# Patient Record
Sex: Female | Born: 1960 | Race: White | Hispanic: No | Marital: Married | Smoking: Never smoker
Health system: Southern US, Community
[De-identification: ages and names within clinical notes are randomized; demographics above are authoritative.]

## PROBLEM LIST (undated history)

## (undated) DIAGNOSIS — J310 Chronic rhinitis: Secondary | ICD-10-CM

## (undated) DIAGNOSIS — F419 Anxiety disorder, unspecified: Secondary | ICD-10-CM

## (undated) DIAGNOSIS — E7800 Pure hypercholesterolemia, unspecified: Secondary | ICD-10-CM

## (undated) DIAGNOSIS — D649 Anemia, unspecified: Secondary | ICD-10-CM

## (undated) DIAGNOSIS — F32A Depression, unspecified: Secondary | ICD-10-CM

## (undated) DIAGNOSIS — F329 Major depressive disorder, single episode, unspecified: Secondary | ICD-10-CM

## (undated) DIAGNOSIS — J329 Chronic sinusitis, unspecified: Secondary | ICD-10-CM

## (undated) DIAGNOSIS — J309 Allergic rhinitis, unspecified: Secondary | ICD-10-CM

## (undated) HISTORY — DX: Allergic rhinitis, unspecified: J30.9

## (undated) HISTORY — DX: Major depressive disorder, single episode, unspecified: F32.9

## (undated) HISTORY — PX: TUBAL LIGATION: SHX77

## (undated) HISTORY — PX: BUNIONECTOMY: SHX129

## (undated) HISTORY — DX: Anxiety disorder, unspecified: F41.9

## (undated) HISTORY — PX: TONSILLECTOMY: SUR1361

## (undated) HISTORY — DX: Chronic rhinitis: J31.0

## (undated) HISTORY — DX: Depression, unspecified: F32.A

## (undated) HISTORY — PX: OTHER SURGICAL HISTORY: SHX169

## (undated) HISTORY — PX: CHOLECYSTECTOMY: SHX55

## (undated) HISTORY — DX: Pure hypercholesterolemia, unspecified: E78.00

## (undated) HISTORY — PX: HEMORRHOID BANDING: SHX5850

## (undated) HISTORY — DX: Anemia, unspecified: D64.9

## (undated) HISTORY — DX: Chronic sinusitis, unspecified: J32.9

---

## 1998-09-11 ENCOUNTER — Inpatient Hospital Stay (HOSPITAL_COMMUNITY): Admission: AD | Admit: 1998-09-11 | Discharge: 1998-09-11 | Payer: Self-pay | Admitting: Obstetrics and Gynecology

## 1999-01-08 ENCOUNTER — Inpatient Hospital Stay (HOSPITAL_COMMUNITY): Admission: AD | Admit: 1999-01-08 | Discharge: 1999-01-11 | Payer: Self-pay | Admitting: Obstetrics and Gynecology

## 1999-01-09 ENCOUNTER — Encounter: Payer: Self-pay | Admitting: Obstetrics and Gynecology

## 1999-03-30 ENCOUNTER — Inpatient Hospital Stay (HOSPITAL_COMMUNITY): Admission: AD | Admit: 1999-03-30 | Discharge: 1999-03-30 | Payer: Self-pay | Admitting: Obstetrics and Gynecology

## 1999-03-31 ENCOUNTER — Inpatient Hospital Stay (HOSPITAL_COMMUNITY): Admission: AD | Admit: 1999-03-31 | Discharge: 1999-03-31 | Payer: Self-pay | Admitting: Obstetrics and Gynecology

## 1999-04-02 ENCOUNTER — Inpatient Hospital Stay (HOSPITAL_COMMUNITY): Admission: AD | Admit: 1999-04-02 | Discharge: 1999-04-02 | Payer: Self-pay | Admitting: Obstetrics and Gynecology

## 1999-04-07 ENCOUNTER — Inpatient Hospital Stay (HOSPITAL_COMMUNITY): Admission: AD | Admit: 1999-04-07 | Discharge: 1999-04-07 | Payer: Self-pay | Admitting: Obstetrics and Gynecology

## 1999-04-07 ENCOUNTER — Observation Stay (HOSPITAL_COMMUNITY): Admission: AD | Admit: 1999-04-07 | Discharge: 1999-04-07 | Payer: Self-pay | Admitting: Obstetrics & Gynecology

## 1999-04-09 ENCOUNTER — Inpatient Hospital Stay (HOSPITAL_COMMUNITY): Admission: AD | Admit: 1999-04-09 | Discharge: 1999-04-12 | Payer: Self-pay | Admitting: Obstetrics and Gynecology

## 1999-04-09 ENCOUNTER — Encounter (INDEPENDENT_AMBULATORY_CARE_PROVIDER_SITE_OTHER): Payer: Self-pay

## 1999-07-15 ENCOUNTER — Encounter: Admission: RE | Admit: 1999-07-15 | Discharge: 1999-08-17 | Payer: Self-pay | Admitting: Obstetrics and Gynecology

## 1999-07-16 ENCOUNTER — Encounter (INDEPENDENT_AMBULATORY_CARE_PROVIDER_SITE_OTHER): Payer: Self-pay

## 1999-07-16 ENCOUNTER — Ambulatory Visit (HOSPITAL_COMMUNITY): Admission: RE | Admit: 1999-07-16 | Discharge: 1999-07-17 | Payer: Self-pay | Admitting: Surgery

## 2000-05-12 ENCOUNTER — Other Ambulatory Visit: Admission: RE | Admit: 2000-05-12 | Discharge: 2000-05-12 | Payer: Self-pay | Admitting: Obstetrics and Gynecology

## 2000-05-29 ENCOUNTER — Encounter: Payer: Self-pay | Admitting: Obstetrics and Gynecology

## 2000-05-29 ENCOUNTER — Ambulatory Visit (HOSPITAL_COMMUNITY): Admission: RE | Admit: 2000-05-29 | Discharge: 2000-05-29 | Payer: Self-pay | Admitting: Obstetrics and Gynecology

## 2000-06-01 ENCOUNTER — Inpatient Hospital Stay (HOSPITAL_COMMUNITY): Admission: AD | Admit: 2000-06-01 | Discharge: 2000-06-01 | Payer: Self-pay | Admitting: Obstetrics and Gynecology

## 2000-10-24 ENCOUNTER — Inpatient Hospital Stay (HOSPITAL_COMMUNITY): Admission: AD | Admit: 2000-10-24 | Discharge: 2000-10-27 | Payer: Self-pay | Admitting: Obstetrics and Gynecology

## 2000-10-24 ENCOUNTER — Encounter (INDEPENDENT_AMBULATORY_CARE_PROVIDER_SITE_OTHER): Payer: Self-pay | Admitting: *Deleted

## 2000-10-28 ENCOUNTER — Encounter: Admission: RE | Admit: 2000-10-28 | Discharge: 2000-11-27 | Payer: Self-pay | Admitting: Obstetrics and Gynecology

## 2003-03-27 ENCOUNTER — Encounter: Admission: RE | Admit: 2003-03-27 | Discharge: 2003-03-27 | Payer: Self-pay | Admitting: Internal Medicine

## 2003-08-01 ENCOUNTER — Encounter: Admission: RE | Admit: 2003-08-01 | Discharge: 2003-08-01 | Payer: Self-pay | Admitting: Obstetrics and Gynecology

## 2004-05-19 ENCOUNTER — Ambulatory Visit (HOSPITAL_COMMUNITY): Admission: RE | Admit: 2004-05-19 | Discharge: 2004-05-19 | Payer: Self-pay | Admitting: Obstetrics and Gynecology

## 2005-07-27 ENCOUNTER — Ambulatory Visit (HOSPITAL_COMMUNITY): Admission: RE | Admit: 2005-07-27 | Discharge: 2005-07-27 | Payer: Self-pay | Admitting: Obstetrics and Gynecology

## 2007-03-08 ENCOUNTER — Ambulatory Visit (HOSPITAL_COMMUNITY): Admission: RE | Admit: 2007-03-08 | Discharge: 2007-03-08 | Payer: Self-pay | Admitting: Obstetrics and Gynecology

## 2007-05-22 ENCOUNTER — Emergency Department (HOSPITAL_COMMUNITY): Admission: EM | Admit: 2007-05-22 | Discharge: 2007-05-22 | Payer: Self-pay | Admitting: Emergency Medicine

## 2008-03-11 ENCOUNTER — Ambulatory Visit (HOSPITAL_COMMUNITY): Admission: RE | Admit: 2008-03-11 | Discharge: 2008-03-11 | Payer: Self-pay | Admitting: Obstetrics and Gynecology

## 2009-03-16 ENCOUNTER — Ambulatory Visit (HOSPITAL_COMMUNITY): Admission: RE | Admit: 2009-03-16 | Discharge: 2009-03-16 | Payer: Self-pay | Admitting: Obstetrics and Gynecology

## 2009-12-22 ENCOUNTER — Ambulatory Visit: Payer: Self-pay | Admitting: Internal Medicine

## 2009-12-22 DIAGNOSIS — J4540 Moderate persistent asthma, uncomplicated: Secondary | ICD-10-CM | POA: Insufficient documentation

## 2009-12-22 DIAGNOSIS — J45909 Unspecified asthma, uncomplicated: Secondary | ICD-10-CM | POA: Insufficient documentation

## 2009-12-22 DIAGNOSIS — J3089 Other allergic rhinitis: Secondary | ICD-10-CM

## 2009-12-22 DIAGNOSIS — J302 Other seasonal allergic rhinitis: Secondary | ICD-10-CM | POA: Insufficient documentation

## 2009-12-22 LAB — CONVERTED CEMR LAB: IgE (Immunoglobulin E), Serum: 44.4 intl units/mL (ref 0.0–180.0)

## 2010-01-28 ENCOUNTER — Ambulatory Visit: Payer: Self-pay | Admitting: Internal Medicine

## 2010-01-28 LAB — PULMONARY FUNCTION TEST

## 2010-03-17 ENCOUNTER — Ambulatory Visit (HOSPITAL_COMMUNITY): Admission: RE | Admit: 2010-03-17 | Discharge: 2010-03-17 | Payer: Self-pay | Admitting: Obstetrics & Gynecology

## 2010-05-28 ENCOUNTER — Ambulatory Visit
Admission: RE | Admit: 2010-05-28 | Discharge: 2010-05-28 | Payer: Self-pay | Source: Home / Self Care | Attending: Internal Medicine | Admitting: Internal Medicine

## 2010-06-15 NOTE — Assessment & Plan Note (Signed)
Summary: rov after pft ///kp   Primary Anija Brickner/Referring Nanette Wirsing:  Synetta Fail, MD  CC:  Follow up visit-review PFT results..  History of Present Illness: History of Present Illness: December 22, 2009- 48 yoF referred courtesy of Dr Leda Quail for pulmonary allergy evaluation, concerned about asthma. Never smoked. No childhood respiratory problems or asthma. Has had variable itching eyes, watery nose, nasal congestion perennially for an uncertain long time.  Wheeze began intermittently 8 years ago, around the time she moved into current home. This was built in 1973. Renovated with all hardwood/ tile, little carpet; basement; no mold; gas heat; no smokers; no indoor pets. No effect when she is away on vacation. In last 4 years has been going to her primary office in [place], Rx'd Advair, prednisone, rescue inhaler. Now on Advair 250/50 which makes her hoarse. Nasonex caused occasional epistaxis, Omnaris keeps her awake. Originally copugh/ wheeze intermittently at night but gradually now daily wheeze. Wheeze worse if nose congested. lat prednisone months ago- dislikes the way it makes her feel. Dyspnea limits her walking or tennis for exercise. Prior allergy skin tested twice, including at [facility]: common inhalants, but never tried allergy vaccine.  Rhinosinusitis eval by ENT w/ rhinoscopy, dx'd staph, Rx'd Bactrim. Got better after that with less nasal congestion and chest also improved then.  January 28, 2010- Asthma....................Marland Kitchenhusband here Regular Singulair has been helping a lot. Recently feeling tight in throat without much itching. Nose clear, but trying regular use of nasal steroid associated with nose bleeds again. Does use the Symbicort twice daily. She will use up left over Advair. She forgot but plans to bring [place] old allergy test results.  PFT- WNL except mild reduction of DLCO 75%. Allergy profile- Total IgE 44.4, neg for specific elevations. CXR- low lung volumes  with basilar scarring   Asthma History    Asthma Control Assessment:    Age range: 12+ years    Symptoms: 0-2 days/week    Nighttime Awakenings: 0-2/month    Interferes w/ normal activity: no limitations    SABA use (not for EIB): 0-2 days/week    FEV1: 2.29 liters (today)    Asthma Control Assessment: Well Controlled   Preventive Screening-Counseling & Management  Alcohol-Tobacco     Smoking Status: never  Current Medications (verified): 1)  Symbicort 160-4.5 Mcg/act Aero (Budesonide-Formoterol Fumarate) .... 2 Puffs and Rinse, Twice Daily 2)  Nasonex 50 Mcg/act Susp (Mometasone Furoate) .Marland Kitchen.. 1-2 Sprays in Each Nostril Once Daily 3)  Omnaris 50 Mcg/act Susp (Ciclesonide) .Marland Kitchen.. 1 Spray in Each Nostril Once Daily 4)  Veramyst 27.5 Mcg/spray Susp (Fluticasone Furoate) .Marland Kitchen.. 1-2 Puffs Each Nostril Once Daily 5)  Singulair 10 Mg Tabs (Montelukast Sodium) .Marland Kitchen.. 1 Daily  Allergies (verified): 1)  ! Levaquin  Past History:  Past Surgical History: Last updated: 12/22/2009 C-section 6045,40981 tonsils  Family History: Last updated: 12/22/2009 Emphysema: mother Allergies:mother/brother Asthma: mother/brother Cancer: mother  Social History: Last updated: 12/22/2009 Married with children Non Smoker ETOH-1-2 weekly  Risk Factors: Smoking Status: never (01/28/2010)  Past Medical History: Allergic Rhinitis Rhinosinusitis Asthma          PFT 01/28/10- FEV1 2.22/ 87% R 0.82, No resp to dilator, DLCO 75%  Review of Systems      See HPI  The patient denies anorexia, fever, weight loss, weight gain, vision loss, decreased hearing, hoarseness, chest pain, syncope, dyspnea on exertion, peripheral edema, prolonged cough, headaches, hemoptysis, abdominal pain, and severe indigestion/heartburn.    Vital Signs:  Patient profile:  50 year old female Height:      64 inches Weight:      209 pounds BMI:     36.00 O2 Sat:      95 % on Room air Pulse rate:   73 / minute BP  sitting:   126 / 84  (right arm) Cuff size:   large  Vitals Entered By: Reynaldo Minium CMA (January 28, 2010 2:59 PM)  O2 Flow:  Room air CC: Follow up visit-review PFT results.   Physical Exam  Additional Exam:  General: A/Ox3; pleasant and cooperative, NAD, SKIN: no rash, lesions NODES: no lymphadenopathy HEENT: [place]/AT, EOM- WNL, Conjuctivae- clear, PERRLA, TM-WNL, Nose- clear, Throat- clear and wnl, Mallampati  II NECK: Supple w/ fair ROM, JVD- none, normal carotid impulses w/o bruits Thyroid- normal to palpation CHEST: clear to P&A HEART: RRR, no m/g/r heard ABDOMEN: Soft and nl;nml bowel sounds; no organomegaly or masses noted. Overweight ZOX:WRUE, nl pulses, no edema  NEURO: Grossly intact to observation      CXR  Procedure date:  12/22/2009  Findings:      CHEST - 2 VIEW   Comparison: 08/01/2003   Findings: Low lung volumes are present, causing crowding of the pulmonary vasculature.  Vague density along the right cardiophrenic angle is thought to probably represent pulmonary venous structures due to the low lung volumes.   Cardiac and mediastinal contours appear unremarkable.   There is linear subsegmental atelectasis at the right lung base.   IMPRESSION:   1.  Low lung volumes with linear subsegmental atelectasis at the right lung base.   Read By:  Dellia Cloud,  M.D.     Released By:  Dellia Cloud,  M.D.  ____  Pulmonary Function Test Date: 01/28/2010 Height (in.): 64 Gender: Female  Pre-Spirometry FVC    Value: 2.76 L/min   Pred: 3.33 L/min     % Pred: 83 % FEV1    Value: 2.29 L     % Pred: 90 % FEV1/FVC  Value: 83 %     Pred: 75 %    FEF 25-75  Value: 2.88 L/min   Pred: 2.92 L/min     % Pred: 99 %  Post-Spirometry FVC    Value: 2.70 L/min   Pred: 3.33 L/min     % Pred: 81 % FEV1    Value: 2.22 L     Pred: 2.54 L     % Pred: 87 % FEV1/FVC  Value: 82 %     Pred: 75 %    FEF 25-75  Value: 2.39 L/min   Pred: 2.92 L/min       Lung Volumes TLC    Value: 4.49 L   % Pred: 4.98 % RV    Value: 1.49 L   % Pred: 1.74 % DLCO    Value: 19.2 %   % Pred: 25.7 % DLCO/VA  Value: 4.94 %   % Pred: 3.99 %  Impression & Recommendations:  Problem # 1:  ASTHMA (ICD-493.90) Markedly improved with regular use of meds- particularly she credits the singulair. She needs a rescue inhaler. PFT now is essentially normal, which doesn't exclude asthma under good conrol.  Problem # 2:  ALLERGIC RHINITIS (ICD-477.9) Not actively symptomatic yet this Fall. The following medications were removed from the medication list:    Nasonex 50 Mcg/act Susp (Mometasone furoate) .Marland Kitchen... 1-2 sprays in each nostril once daily    Omnaris 50 Mcg/act Susp (Ciclesonide) .Marland Kitchen... 1 spray in each nostril  once daily    Veramyst 27.5 Mcg/spray Susp (Fluticasone furoate) .Marland Kitchen... 1-2 puffs each nostril once daily  Medications Added to Medication List This Visit: 1)  Proair Hfa 108 (90 Base) Mcg/act Aers (Albuterol sulfate) .... 2 puffs four times a day as needed rescue inhaler  Other Orders: Est. Patient Level IV (16109)  Patient Instructions: 1)  Please schedule a follow-up appointment in 4 months. 2)  Please send the results of your old allergy test when you can 3)  Ok to use either Advair or Symbicort. 4)  Script for rescue inhaler Prescriptions: PROAIR HFA 108 (90 BASE) MCG/ACT AERS (ALBUTEROL SULFATE) 2 puffs four times a day as needed rescue inhaler  #1 x prn   Entered and Authorized by:   Waymon Budge MD   Signed by:   Waymon Budge MD on 01/28/2010   Method used:   Print then Give to Patient   RxID:   [id number]    Immunization History:  Influenza Immunization History:    Influenza:  historical (01/28/2010)     CXR  Procedure date:  12/22/2009  Findings:      CHEST - 2 VIEW   Comparison: 08/01/2003   Findings: Low lung volumes are present, causing crowding of the pulmonary vasculature.  Vague density along the right  cardiophrenic angle is thought to probably represent pulmonary venous structures due to the low lung volumes.   Cardiac and mediastinal contours appear unremarkable.   There is linear subsegmental atelectasis at the right lung base.   IMPRESSION:   1.  Low lung volumes with linear subsegmental atelectasis at the right lung base.   Read By:  Dellia Cloud,  M.D.     Released By:  Dellia Cloud,  M.D.  ____

## 2010-06-15 NOTE — Assessment & Plan Note (Signed)
Summary: asthma/ mbw   Primary Provider/Referring Provider:  Synetta Fail, MD  CC:  Pulmonary Consult-allergy induced asthma; Dr. Leda Quail..  History of Present Illness: December 22, 2009- 50 yoF referred courtesy of Dr Leda Quail for pulmonary allergy evaluation, concerned about asthma. Never smoked. No childhood respiratory problems or asthma. Has had variable itching eyes, watery nose, nasal congestion perennially for an uncertain long time.  Wheeze began intermittently 8 years ago, around the time she moved into current home. This was built in 1973. Renovated with all hardwoo/ tile, little carpet; basement; no mold; gas heat; no smokers; no indoor pets. No effect when she is away on vacation. In last 4 years has been going to her primary office in [place], Rx'd Advair, prednisone, rescue inhaler. Now on Advair 250/50 which makes her hoarse. Nasonex caused occasional epistaxis, Omnaris keeps her awake. Originally copugh/ wheeze intermittently at night but gradually now daily wheeze. Wheeze worse if nose congested. lat prednisone months ago- dislikes the way it makes her feel. Dyspnea limits her walking or tennis for exercise. Prior allergy skin tested twice, including at [facility]: common inhalants, but never tried allergy vaccine.  Rhinosinusitis eval by ENT w/ rhinoscopy, dx'd staph, Rx'd Bactrim. Got better after that with less nasal congestion and chest also improved then.  Asthma History    Initial Asthma Severity Rating:    Age range: 12+ years    Symptoms: >2 days/week; not daily    Nighttime Awakenings: 0-2/month    Interferes w/ normal activity: some limitations    SABA use (not for EIB): daily    Asthma Severity Assessment: Moderate Persistent   Preventive Screening-Counseling & Management  Alcohol-Tobacco     Smoking Status: never  Current Medications (verified): 1)  Advair Diskus 250-50 Mcg/dose Aepb (Fluticasone-Salmeterol) .Marland Kitchen.. 1 Puff Two Times A Day and Rinse  Mouth Well 2)  Nasonex 50 Mcg/act Susp (Mometasone Furoate) .Marland Kitchen.. 1-2 Sprays in Each Nostril Once Daily 3)  Omnaris 50 Mcg/act Susp (Ciclesonide) .Marland Kitchen.. 1 Spray in Each Nostril Once Daily  Allergies (verified): 1)  ! Levaquin  Past History:  Family History: Last updated: 12/22/2009 Emphysema: mother Allergies:mother/brother Asthma: mother/brother Cancer: mother  Social History: Last updated: 12/22/2009 Married with children Non Smoker ETOH-1-2 weekly  Risk Factors: Smoking Status: never (12/22/2009)  Past Medical History: Allergic Rhinitis Rhinosinusitis Asthma  Past Surgical History: C-section 1610,96045 tonsils  Family History: Emphysema: mother Allergies:mother/brother Asthma: mother/brother Cancer: mother  Social History: Married with children Non Smoker ETOH-1-2 weekly Smoking Status:  never  Review of Systems      See HPI       The patient complains of shortness of breath with activity, productive cough, acid heartburn, weight change, sore throat, nasal congestion/difficulty breathing through nose, anxiety, hand/feet swelling, and joint stiffness or pain.  The patient denies shortness of breath at rest, non-productive cough, coughing up blood, chest pain, irregular heartbeats, indigestion, loss of appetite, abdominal pain, difficulty swallowing, tooth/dental problems, headaches, sneezing, itching, ear ache, depression, rash, change in color of mucus, and fever.    Vital Signs:  Patient profile:   50 year old female Weight:      214.38 pounds O2 Sat:      94 % on Room air Pulse rate:   84 / minute BP sitting:   110 / 68  (right arm) Cuff size:   regular  Vitals Entered By: Reynaldo Minium CMA (December 22, 2009 3:11 PM)  O2 Flow:  Room air CC: Pulmonary Consult-allergy induced asthma;  Dr. Leda Quail.   Physical Exam  Additional Exam:  General: A/Ox3; pleasant and cooperative, NAD, SKIN: no rash, lesions NODES: no lymphadenopathy HEENT: [place]/AT,  EOM- WNL, Conjuctivae- clear, PERRLA, TM-WNL, Nose- clear, Throat- clear and wnl, Mallampati  II NECK: Supple w/ fair ROM, JVD- none, normal carotid impulses w/o bruits Thyroid- normal to palpation CHEST: I&E coarse mild wheeze, unlabored HEART: RRR, no m/g/r heard ABDOMEN: Soft and nl; nml bowel sounds; no organomegaly or masses noted. Overweight ONG:EXBM, nl pulses, no edema  NEURO: Grossly intact to observation      Impression & Recommendations:  Problem # 1:  ALLERGIC RHINITIS (ICD-477.9)  Perennial allergic rhinitis. Will try Veramyst since she has had issues with nasonex and omnaris, Also try decongestants. She will get [place] results of past skin testing for review. Her updated medication list for this problem includes:    Nasonex 50 Mcg/act Susp (Mometasone furoate) .Marland Kitchen... 1-2 sprays in each nostril once daily    Omnaris 50 Mcg/act Susp (Ciclesonide) .Marland Kitchen... 1 spray in each nostril once daily    Veramyst 27.5 Mcg/spray Susp (Fluticasone furoate) .Marland Kitchen... 1-2 puffs each nostril once daily  Problem # 2:  ASTHMA (ICD-493.90) She is having some hoarseness. We will have her try Symbicort 160, with question whether Advair dry powder is causing the hoarseness., Try  Singulair again, get IgE allergy profile, have her  bring skin test results from previous doctor, get PFT, CXR She should do ok with planned upcoming colonoscopy. She questions if there is an unrecognized food allergy. i doubt that, but we will get allergy profiles.  Medications Added to Medication List This Visit: 1)  Advair Diskus 250-50 Mcg/dose Aepb (Fluticasone-salmeterol) .Marland Kitchen.. 1 puff two times a day and rinse mouth well 2)  Symbicort 160-4.5 Mcg/act Aero (Budesonide-formoterol fumarate) .... 2 puffs and rinse, twice daily 3)  Nasonex 50 Mcg/act Susp (Mometasone furoate) .Marland Kitchen.. 1-2 sprays in each nostril once daily 4)  Omnaris 50 Mcg/act Susp (Ciclesonide) .Marland Kitchen.. 1 spray in each nostril once daily 5)  Veramyst 27.5 Mcg/spray Susp  (Fluticasone furoate) .Marland Kitchen.. 1-2 puffs each nostril once daily 6)  Singulair 10 Mg Tabs (Montelukast sodium) .Marland Kitchen.. 1 daily  Other Orders: Consultation Level IV (84132) T-2 View CXR (71020TC) Full Pulmonary Function Test (PFT) T-Food Allergy Profile Specific IgE ([id number]) T-Allergy Profile [place], [place], [place], [place], [place] [phone])  Patient Instructions: 1)  Please schedule a follow-up appointment in 1 month. 2)  A chest x-ray has been recommended.  Your imaging study may require preauthorization.  3)  Schedule PFT 4)  Lab 5)  Try changing Advair to sample Symbicort 160/4.5: 6)  2 puffs and rinse mouth, twice every day 7)  samples Singulair 10 mg, 1 daily 8)  Sample Veramyst nasal spray: 9)  1-2 puffs each nostril once every day at bedtime 10)  Try decongestant otc Sudafed-PE if stuffy nose 11)  ask husband if you have been snoring 12)  Get former allergy skin test result Prescriptions: SINGULAIR 10 MG TABS (MONTELUKAST SODIUM) 1 daily  #7 x 0   Entered and Authorized by:   Waymon Budge MD   Signed by:   Waymon Budge MD on 12/22/2009   Method used:   Samples Given   RxID:   [id number] SYMBICORT 160-4.5 MCG/ACT AERO (BUDESONIDE-FORMOTEROL FUMARATE) 2 puffs and rinse, twice daily  #1 x prn   Entered and Authorized by:   Waymon Budge MD   Signed by:   Waymon Budge MD on 12/22/2009  Method used:   Samples Given   RxID:   [id number] VERAMYST 27.5 MCG/SPRAY SUSP (FLUTICASONE FUROATE) 1-2 puffs each nostril once daily  #1 x 0   Entered and Authorized by:   Waymon Budge MD   Signed by:   Waymon Budge MD on 12/22/2009   Method used:   Samples Given   RxID:   [id number]

## 2010-06-15 NOTE — Miscellaneous (Signed)
Summary: Orders Update pft charges  Clinical Lists Changes  Orders: Added new Service order of Carbon Monoxide diffusing w/capacity (94720) - Signed Added new Service order of Lung Volumes (94240) - Signed Added new Service order of Spirometry (Pre & Post) (94060) - Signed 

## 2010-06-17 NOTE — Assessment & Plan Note (Signed)
Summary: Amy Rollins ///KP   Primary Provider/Referring Provider:  Synetta Fail, MD  CC:  Follow up visit-asthma and allergic rhinitis. Dry cough today; occasionally productive at times (yellow in color)..  History of Present Illness: January 28, 2010- Asthma....................Marland Kitchenhusband here Regular Singulair has been helping a lot. Recently feeling tight in throat without much itching. Nose clear, but trying regular use of nasal steroid associated with nose bleeds again. Does use the Symbicort twice daily. She will use up left over Advair. She forgot but plans to bring [place] old allergy test results.  PFT- WNL except mild reduction of DLCO 75%. Allergy profile- Total IgE 44.4, neg for specific elevations. CXR- low lung volumes with basilar scarring  May 28, 2010- Asthma Nurse-CC: Follow up visit-asthma and allergic rhinitis. Dry cough today; occasionally productive at times (yellow in color). Since last here one minor head cold in late Fall, but feeling well now. Some DOE with brisk walk may be with a little tightness and wheeze. She stil lies the Singulair. Currently back to using up Advair she had, after using the Symbicort sample- can't tell that one was better but we talked over the copmparison and also discussed generic sigulair.   Asthma History    Asthma Control Assessment:    Age range: 12+ years    Symptoms: >2 days/week    Nighttime Awakenings: 0-2/month    Interferes w/ normal activity: some limitations    SABA use (not for EIB): >2 days/week    FEV1: 2.29 liters (today)    Asthma Control Assessment: Not Well Controlled   Preventive Screening-Counseling & Management  Alcohol-Tobacco     Smoking Status: never  Current Medications (verified): 1)  Symbicort 160-4.5 Mcg/act Aero (Budesonide-Formoterol Fumarate) .... 2 Puffs and Rinse, Twice Daily 2)  Singulair 10 Mg Tabs (Montelukast Sodium) .Marland Kitchen.. 1 Daily 3)  Proair Hfa 108 (90 Base) Mcg/act Aers (Albuterol Sulfate) .... 2  Puffs Four Times A Day As Needed Rescue Inhaler 4)  Advair Diskus 250-50 Mcg/dose Aepb (Fluticasone-Salmeterol) .Marland Kitchen.. 1 Puff Two Times A Day and Rinse Mouth After Use 5)  Nasonex 50 Mcg/act Susp (Mometasone Furoate) .... 2 Sprays in Each Nostril At Bedtime  Allergies (verified): 1)  ! Levaquin  Past History:  Past Medical History: Last updated: 01/28/2010 Allergic Rhinitis Rhinosinusitis Asthma          PFT 01/28/10- FEV1 2.22/ 87% R 0.82, No resp to dilator, DLCO 75%  Past Surgical History: Last updated: 12/22/2009 C-section 1610,96045 tonsils  Family History: Last updated: 05/28/2010 Emphysema: mother Allergies:mother/brother Asthma: mother/brother Cancer: mother Father- died ALS, urosepsis  Social History: Last updated: 12/22/2009 Married with children Non Smoker ETOH-1-2 weekly  Risk Factors: Smoking Status: never (05/28/2010)  Family History: Emphysema: mother Allergies:mother/brother Asthma: mother/brother Cancer: mother Father- died ALS, urosepsis  Review of Systems      See HPI       The patient complains of shortness of breath with activity, nasal congestion/difficulty breathing through nose, and sneezing.  The patient denies shortness of breath at rest, productive cough, non-productive cough, coughing up blood, chest pain, irregular heartbeats, acid heartburn, indigestion, loss of appetite, weight change, abdominal pain, difficulty swallowing, sore throat, tooth/dental problems, and headaches.    Vital Signs:  Patient profile:   50 year old female Height:      64 inches Weight:      214.13 pounds BMI:     36.89 O2 Sat:      94 % on Room air Pulse rate:   76 /  minute BP sitting:   116 / 84  (right arm) Cuff size:   regular  Vitals Entered By: Reynaldo Minium CMA (May 28, 2010 10:12 AM)  O2 Flow:  Room air CC: Follow up visit-asthma and allergic rhinitis. Dry cough today; occasionally productive at times (yellow in color).   Physical  Exam  Additional Exam:  General: A/Ox3; pleasant and cooperative, NAD, SKIN: no rash, lesions NODES: no lymphadenopathy HEENT: [place]/AT, EOM- WNL, Conjuctivae- clear, PERRLA, TM-WNL, Nose- clear, Throat- clear and wnl, Mallampati  II NECK: Supple w/ fair ROM, JVD- none, normal carotid impulses w/o bruits Thyroid- normal to palpation CHEST: I&E wheeze, unlabored HEART: RRR, no m/g/r heard ABDOMEN:  Overweight EAV:WUJW, nl pulses, no edema  NEURO: Grossly intact to observation      Pre-Spirometry FEV1    Value: 2.29 L     Impression & Recommendations:  Problem # 1:  ASTHMA (ICD-493.90) I would like better control if possible. We may want to add Spiriva but first I will let her try a sample of Advair 500 gfor a week and see if that resets her. she was visitng an old house with ?mold up until a week a go and may need time away from that.   Problem # 2:  ALLERGIC RHINITIS (ICD-477.9)  Mild stuffiness, using Nasonex.  Her updated medication list for this problem includes:    Nasonex 50 Mcg/act Susp (Mometasone furoate) .Marland Kitchen... 2 sprays in each nostril at bedtime  Medications Added to Medication List This Visit: 1)  Advair Diskus 250-50 Mcg/dose Aepb (Fluticasone-salmeterol) .Marland Kitchen.. 1 puff two times a day and rinse mouth after use 2)  Nasonex 50 Mcg/act Susp (Mometasone furoate) .... 2 sprays in each nostril at bedtime  Other Orders: Est. Patient Level III (11914) Pneumococcal Vaccine (78295) Admin 1st Vaccine (62130)  Patient Instructions: 1)  Please schedule a follow-up appointment in 1 month. 2)  Sample Advair 500 to use up, then return to your Advair 250 3)      1 puff and rinse mouth, twice daily 4)  Refill scripts Singulair Prescriptions: SINGULAIR 10 MG TABS (MONTELUKAST SODIUM) 1 daily  #90 x 3   Entered and Authorized by:   Waymon Budge MD   Signed by:   Waymon Budge MD on 05/28/2010   Method used:   Print then Give to Patient   RxID:   [id number] SINGULAIR 10  MG TABS (MONTELUKAST SODIUM) 1 daily  #30 x prn   Entered and Authorized by:   Waymon Budge MD   Signed by:   Waymon Budge MD on 05/28/2010   Method used:   Print then Give to Patient   RxID:   [id number]    Immunizations Administered:  Pneumonia Vaccine:    Vaccine Type: Pneumovax    Site: left deltoid    Mfr: Merck    Dose: 0.5 ml    Route: IM    Given by: Zackery Barefoot CMA    Exp. Date: 09/23/2011    Lot #: [id number]

## 2010-07-01 ENCOUNTER — Encounter: Payer: Self-pay | Admitting: Internal Medicine

## 2010-07-01 ENCOUNTER — Ambulatory Visit (INDEPENDENT_AMBULATORY_CARE_PROVIDER_SITE_OTHER): Payer: 59 | Admitting: Internal Medicine

## 2010-07-01 DIAGNOSIS — J45909 Unspecified asthma, uncomplicated: Secondary | ICD-10-CM

## 2010-07-01 DIAGNOSIS — J309 Allergic rhinitis, unspecified: Secondary | ICD-10-CM

## 2010-07-07 NOTE — Assessment & Plan Note (Signed)
Summary: 1 month return/mhh   Primary Provider/Referring Provider:  Synetta Fail, MD  CC:  1 month follow up visit-asthma and allergies; denies any SOB or wheezing; "doing better some":Marland Kitchen  History of Present Illness: January 28, 2010- Asthma....................Marland Kitchenhusband here Regular Singulair has been helping a lot. Recently feeling tight in throat without much itching. Nose clear, but trying regular use of nasal steroid associated with nose bleeds again. Does use the Symbicort twice daily. She will use up left over Advair. She forgot but plans to bring [place] old allergy test results.  PFT- WNL except mild reduction of DLCO 75%. Allergy profile- Total IgE 44.4, neg for specific elevations. CXR- low lung volumes with basilar scarring  May 28, 2010- Asthma Nurse-CC: Follow up visit-asthma and allergic rhinitis. Dry cough today; occasionally productive at times (yellow in color). Since last here one minor head cold in late Fall, but feeling well now. Some DOE with brisk walk may be with a little tightness and wheeze. She stil lies the Singulair. Currently back to using up Advair she had, after using the Symbicort sample- can't tell that one was better but we talked over the copmparison and also discussed generic sigulair.   July 01, 2010- Asthma Nurse-CC: 1 month follow up visit-asthma and allergies; denies any SOB or wheezing; "doing better some":  Had red local reaction to her pneumonia vaccine. We educated that she does not need another pneumovax. Stuffy nose x 2 days. Feels like a cold.  Discussed cold vs allergy. Not sneezing. Asthma- the increased dose of Advair really helped. Chest is doing well- has not needed rescue inhaler.      Asthma History    Asthma Control Assessment:    Age range: 12+ years    Symptoms: 0-2 days/week    Nighttime Awakenings: 0-2/month    Interferes w/ normal activity: no limitations    SABA use (not for EIB): 0-2 days/week    FEV1: 2.29 liters  (today)    Asthma Control Assessment: Well Controlled   Preventive Screening-Counseling & Management  Alcohol-Tobacco     Smoking Status: never  Current Medications (verified): 1)  Singulair 10 Mg Tabs (Montelukast Sodium) .Marland Kitchen.. 1 Daily 2)  Proair Hfa 108 (90 Base) Mcg/act Aers (Albuterol Sulfate) .... 2 Puffs Four Times A Day As Needed Rescue Inhaler 3)  Advair Diskus 250-50 Mcg/dose Aepb (Fluticasone-Salmeterol) .Marland Kitchen.. 1 Puff Two Times A Day and Rinse Mouth After Use 4)  Nasonex 50 Mcg/act Susp (Mometasone Furoate) .... 2 Sprays in Each Nostril At Bedtime  Allergies (verified): 1)  ! Levaquin  Past History:  Past Medical History: Last updated: 01/28/2010 Allergic Rhinitis Rhinosinusitis Asthma          PFT 01/28/10- FEV1 2.22/ 87% R 0.82, No resp to dilator, DLCO 75%  Family History: Last updated: 05/28/2010 Emphysema: mother Allergies:mother/brother Asthma: mother/brother Cancer: mother Father- died ALS, urosepsis  Social History: Last updated: 12/22/2009 Married with children Non Smoker ETOH-1-2 weekly  Risk Factors: Smoking Status: never (07/01/2010)  Past Surgical History: C-section 3664,40347 tonsils Basal cell CA from right side of nose  Review of Systems      See HPI       The patient complains of nasal congestion/difficulty breathing through nose.  The patient denies shortness of breath with activity, shortness of breath at rest, productive cough, non-productive cough, coughing up blood, chest pain, irregular heartbeats, acid heartburn, indigestion, loss of appetite, weight change, abdominal pain, difficulty swallowing, sore throat, tooth/dental problems, headaches, and sneezing.    Vital  Signs:  Patient profile:   50 year old female Height:      64 inches Weight:      215.38 pounds BMI:     37.10 O2 Sat:      97 % on Room air Pulse rate:   92 / minute BP sitting:   130 / 78  (left arm) Cuff size:   regular  Vitals Entered By: Reynaldo Minium CMA  (July 01, 2010 9:53 AM)  O2 Flow:  Room air CC: 1 month follow up visit-asthma and allergies; denies any SOB or wheezing; "doing better some":   Physical Exam  Additional Exam:  General: A/Ox3; pleasant and cooperative, NAD, SKIN: surgical wound healing removal skin cancer NODES: no lymphadenopathy HEENT: Lipscomb/AT, EOM- WNL, Conjuctivae- clear, PERRLA, TM-WNL, Nose- stuffy turbinate edema, Throat- clear and wnl, Mallampati  II NECK: Supple w/ fair ROM, JVD- none, normal carotid impulses w/o bruits Thyroid- normal to palpation CHEST: clear to P&A HEART: RRR, no m/g/r heard ABDOMEN:  Overweight JYN:WGNF, nl pulses, no edema  NEURO: Grossly intact to observation      Pre-Spirometry FEV1    Value: 2.29 L     Impression & Recommendations:  Problem # 1:  ALLERGIC RHINITIS (ICD-477.9)  she can only use her nasal steroid every other day to avoid bleeding. We will have her try a decongestant.  Her updated medication list for this problem includes:    Nasonex 50 Mcg/act Susp (Mometasone furoate) .Marland Kitchen... 2 sprays in each nostril at bedtime  Problem # 2:  ASTHMA (ICD-493.90)  Good control. We wil continue  meds.  Her updated medication list for this problem includes:    Nasonex 50 Mcg/act Susp (Mometasone furoate) .Marland Kitchen... 2 sprays in each nostril at bedtime  Orders: Est. Patient Level III (62130)  Patient Instructions: 1)  Please schedule a follow-up appointment in 4 months. 2)  Try otc decongestant like Sudafed (sign at counter) or  3)        phenylephrine  ( "-PE", like Sudafed-PE) 4)  Continue asthma meds.

## 2010-09-14 ENCOUNTER — Telehealth: Payer: Self-pay | Admitting: Internal Medicine

## 2010-09-14 MED ORDER — FLUTICASONE-SALMETEROL 500-50 MCG/DOSE IN AEPB: 1.0000 | INHALATION_SPRAY | Freq: Two times a day (BID) | RESPIRATORY_TRACT | Status: DC

## 2010-09-14 MED ORDER — AZITHROMYCIN 250 MG PO TABS: 250.0000 mg | ORAL_TABLET | Freq: Every day | ORAL | Status: AC

## 2010-09-14 NOTE — Telephone Encounter (Signed)
Per [facility]-okay to give Zpak #1 take as directed no refills also to use Tylenol as needed for fevers. I spoke with pt's husband and he is aware we are sending Rx's to [facility] on [address].

## 2010-09-14 NOTE — Telephone Encounter (Signed)
Unable to see patient today-we can call in Advair 500/50 #1 1 puff bid and Rinse mouth after use no refills.Vivianne Spence

## 2010-09-14 NOTE — Telephone Encounter (Signed)
Pt having yellow and green mucus and fever.  Would you like to give an abx or have anything further recs for pt?  Pls advise.  Thanks!

## 2010-09-14 NOTE — Telephone Encounter (Signed)
Called, spoke with pt.  She c/o wheezing constantly, coughing with a lot mucus - yellow and green, HA, body aches, some increased SOB, hoarse, and fever.  States sxs started on Sunday but are gradually getting worse.  Using mucinex along with advil, advair, and nasonex.  She is requesting OV. She believes advair may need to be increased like it has been in the past.  [facility] has no openings.  Dr. Maple Hudson, pls advise if pt can be worked in.  Thanks!  Allergies verified [facility] [address]  Allergies  Allergen Reactions  . Levofloxacin     REACTION: severe-hives,edema,throat closed up

## 2010-09-20 ENCOUNTER — Telehealth: Payer: Self-pay | Admitting: Internal Medicine

## 2010-09-20 MED ORDER — CEFDINIR 300 MG PO CAPS: 300.0000 mg | ORAL_CAPSULE | Freq: Two times a day (BID) | ORAL | Status: AC

## 2010-09-20 NOTE — Telephone Encounter (Signed)
Called and spoke with pt.  Pt states CY prescribed her a zpak last week on 5-1.  Pt finished z pak and states she is feeling "a little better."  But states she is still coughing up large amounts of yellow sputum, headache across lower back of head, hoarseness, sob, no energy, and body aches. Also c/o tightness in center of chest.  Denies a fever.  CY, please advise.  Thanks.    Allergies: Levaquin

## 2010-09-20 NOTE — Telephone Encounter (Signed)
Per Dr. Maple Hudson- this may not be bacterial, but we can try cefdinir 300 mg #14 1 bid and also need to suggest mucinex. Spoke with pt and advised of these recs and she verbalized understanding.  She denied any questions, abx was sent to [facility] [address].

## 2010-10-01 NOTE — Op Note (Signed)
[facility]  [name]                       MRN: [id number] Proc. Date: 04/09/99 Adm. Date:  [id number] Attending:  Dierdre Forth Pearline                           Operative Report  PREOPERATIVE DIAGNOSES:       1. Term intrauterine pregnancy.                               2. Meconium-stained amniotic fluid.                               3. Non-reassuring fetal heart rate tracing.  POSTOPERATIVE DIAGNOSES:      1. Term intrauterine pregnancy.                               2. Meconium-stained amniotic fluid.                               3. Non-reassuring fetal heart rate tracing.                               4. Failure to descend.                               5. Unsuccessful vacuum extraction.                               6. Vaginal laceration.  PROCEDURES:                   1. Attempt at vacuum extraction vaginal delivery.                               2. Primary low transverse cesarean section.                               3. Repair of vaginal laceration.  OBSTETRICIAN:                 Janine Limbo, M.D.  FIRST ASSESSMENT:             Vance Gather Duplantis, C.N.M.  ANESTHESIA:                   Epidural.  DISPOSITION:                  Amy Rollins is a 50 year old female, gravida 1,  para 0, who presents at term.  The patient was given Pitocin to augment her labor. The patient was noted to have variable decelerations throughout the course of her labor.  She was given an amnioinfusion.  The patient was completely dilated at approximately 8:50 p.m.  The patient was allowed to push.  The variable decelerations became repetitive and the patient was given the option of continued pushing, observation only, cesarean  delivery, forcep delivery and vacuum extraction vaginal delivery.  Risks and benefits of each of those options were reviewed; the patient and her husband elected to proceed with vacuum extraction.   The specific risks associated with vacuum extraction were reviewed including caput formation, hematoma formation and the risk of intracranial bleeding.  The patient was also  told of the risk that the vacuum extraction would be unsuccessful and that we would need to proceed with cesarean delivery.  The risks and benefits of cesarean delivery were reviewed including, but not limited to, anesthetic complications,  bleeding, infections, and possible damage to the surrounding organs.  The patients mother and father were also present.  DESCRIPTION OF PROCEDURE:     The patient was taken to the operating room for a  double-setup delivery.  Once in the operating room, the perineum was prepped with multiple layers of Betadine.  The patient was sterilely draped.  A Foley catheter had previously been placed and this was removed.  The fetal head was noted to be a +3 station when she pushed.  The head was in an occipitoanterior position and the cervix was completely dilated.  The Kiwi vacuum extractor was applied.  The patient was allowed to push.  A laceration was noted on the right vaginal sidewall and blood was noted to get into the Kiwi vacuum extractor.  A second Kiwi vacuum extractor was obtained and again, blood was noted to get into the apparatus. We then applied the Mityvac vacuum extractor and even with the patient pushing with her best effort, we were unable to deliver the fetal head.  One pop-off occurred with the Mityvac vacuum extractor and one pop-off occurred with the Kiwi vacuum  extractor.  The decision was made to discontinue attempts at vacuum extraction nd to proceed with cesarean delivery.  A Foley catheter was again placed in the bladder.  The patient was turned to the supine position.  The patients abdomen was prepped with multiple layers of Betadine and then sterilely draped.  A low transverse incision was made in the abdomen and carried sharply through the  subcutaneous tissue, the fascia and the  anterior peritoneum.  An incision was made in the lower uterine segment and extended transversely.  The fetal head was delivered.  The mouth and nose were suctioned using the DeLee trap.  A nuchal cord was noted to be present.  The nuchal cord was reduced.  The remainder of the infant was delivered and the infant was  handed to the awaiting pediatric team.  A segment of cord was clamped for cord blood pH.  Routine cord blood studies were obtained.  The placenta was manually  removed.  The uterine cavity was cleaned of amniotic fluid, clotted blood and membranes.  The weight of the infant is currently not known.  The Apgars were 1 at 1 minute, 6 at 5 five minute and 6 at 10 minutes.  A female infant was delivered.  There was no meconium beneath the vocal cords.  The uterus, tubes and ovaries were noted to be normal.  The uterine incision was closed using a running-locking suture of 2-0 Vicryl.  A figure-of-eight suture of 2-0 Vicryl was placed for hemostasis. Hemostasis was adequate.  The pelvis and the abdominal cavity were irrigated with copious amounts of fluid.  Hemostasis was noted to be adequate.  The anterior peritoneum was reapproximated in the midline.  The abdominal musculature and the fascia were irrigated.  Hemostasis was adequate.  The fascia was  closed using a  running suture of 0 Vicryl, followed by three interrupted sutures of 0 Vicryl. The subcutaneous tissue was closed using a running suture of 2-0 Vicryl.  The skin as reapproximated using skin staples.  Sponge, needle and instrument counts were correct.  The estimated blood loss for this portion of the procedure was 500 cc. We then placed the patient back in a lithotomy position.  The laceration on the  right vaginal sidewall was repaired using a running suture of 2-0 chromic catgut. There was a small laceration noted beneath the urethra and this area  was repaired using 2-0 chromic.  Hemostasis was noted to be adequate.  The estimated blood loss from the vaginal procedure was approximately 300 cc, for a total of 800 cc blood loss for overall procedure.  The patient was noted to drain slightly blood-tinged  urine.  The patient was taken from the operating room to the recovery room in stable condition.  The infant was taken to the intensive care nursery for observation only.  The cord blood pH was 6.90. DD:  04/09/99 TD:  04/11/99 Job: [id number]

## 2010-10-01 NOTE — Op Note (Signed)
[facility]  Patient:    Amy Rollins, Amy Rollins                      MRN: [id number] Proc. Date: 07/16/99 Adm. Date:  [id number] Attending:  Katha Cabal CC:         Lilyan Punt. Sydnee Levans, M.D.             Vanessa P. Pennie Rushing, M.D.             Francesco Sor, M.D.                           Operative Report  PREOPERATIVE DIAGNOSIS:  Gallstones with chronic cholecystitis during pregnancy.  POSTOPERATIVE DIAGNOSIS:  Chronic cholecystitis.  PROCEDURE:  Laparoscopic cholecystectomy.  SURGEON:  Thornton Park. Daphine Deutscher, M.D.  ASSISTANT:  Romeo Apple Foxworth  DESCRIPTION OF PROCEDURE:  Ms. Ryden was taken to room 16 on the morning of  July 16, 1999 and given general anesthesia.  The abdomen was prepped with Betadine and draped sterilely.  I excised and rolled a transverse incision down to the lower umbilicus; entered the abdomen through a transverse incision, and a pursestring  suture to insert this on without difficulty.  Once inserted, I insufflated and surveyed the abdomen; found the gallbladder to be adhesed to some chronic adhesions all the way up the fundus.  Three trocars were placed in the upper abdomen after injecting with some Marcaine.  The gallbladder was then grasped, elevated and taken down with sharp dissection from these chronic adhesions, down to the omentum and to the duodenum.  Then exposing the infundibulum, I completed the dissection of Calots triangle and the posterior aspect of Calots triangle.   This was  to clearly delineate the infundibulum and the formation of the cystic duct in its junction with the common duct.  I then put a clip upon the gallbladder and incised the cystic duct, and milked back from the common duct up to the gallbladder; did not retrieve any stones that appeared to be impacted in there.  I then triple-clipped the cystic duct and divided it; triple-clipped the cystic artery and divided it.   I then  removed the gallbladder from the gallbladder bed with the ook electrocautery.  The gallbladder was not entered and was detached successfully rom the gallbladder bed.  Electrocautery was used to control bleeding, and none was  seen prior to detachment.  We lowered the pressure and I irrigated with plenty f saline; did not see any evidence of bleeding or bowel leaks.  The gallbladder was then detached and brought out through the umbilicus.  Multiple small stones could be palpated.  The umbilical port was tied down and this closed the umbilicus completely.  All the wounds were injected with Marcaine, and then the irrigant as removed from the abdomen, and the abdomen was deflated.  The upper midline trocar was approximated with a single suture of 0 Vicryl in the fascia.  The wounds were then closed with 4-0 Vicryl, with Benzoin and Steri-Strips.  The patient tolerated the procedure well and was taken to the recovery room in satisfactory condition. DD:  07/16/99 TD:  07/17/99 Job: [id number]

## 2010-10-01 NOTE — Op Note (Signed)
[facility]  Patient:    Amy Rollins, Amy Rollins                  MRN: [id number] Proc. Date: 05/29/00 Attending:  Erie Noe P. Pennie Rushing, M.D.                           Operative Report  PREOPERATIVE DIAGNOSIS:       Maternal age 50.  POSTOPERATIVE DIAGNOSIS:      Maternal age 49.  OPERATION/PROCEDURE:          Genetic amniocentesis.  SURGEON:                      Vanessa P. Pennie Rushing, M.D.  ANESTHESIA:                   Local.  ESTIMATED BLOOD LOSS:         Less than 5 cc.  COMPLICATIONS:                None.  FINDINGS:                     The fetal measurements were consistent with a 17-[redacted] week gestation.  There was a posterior placenta and a normal amount of amniotic fluid.  DESCRIPTION OF PROCEDURE:     The patient was lying on the ultrasound table in the supine position and an area of fluid that was free of fetal part cord was identified in the right paramedian space just below the umbilicus.  The area was marked and then prepped with multiple layers of Betadine.  A local anesthetic of 1% xylocaine was placed.  With a single pass of 20 gauge spinal needle the amniotic fluid was accessed and a total of 20 cc of clear amniotic fluid withdrawn, 10 cc in the first syringe and then 10 cc in the second syringe.  The amniocentesis site was then documented by ultrasound and the amniocentesis needle removed.  The post amniocentesis heart rate was 155 beats per minute.  The patients blood type is A-positive.  The patient tolerated the procedure well and the fluid was sent to [facility] for analysis of amniotic fluid, AFP, and chromosomes. DD:  05/29/00 TD:  05/29/00 Job: [id number]

## 2010-10-01 NOTE — H&P (Signed)
[facility]  PatientNaraly Fritcher [facility]                       MRN: [id number] Adm. Date:  [id number] Attending:  Cleatrice Burke Dictator:   Maggie Schwalbe, C.N.M.                         History and Physical  DATE OF BIRTH:                [birth year].  HISTORY OF PRESENT ILLNESS:   This is a 50 year old gravida 1, para 0 at 39-6/7  weeks with contractions for the previous 24 hours with severe back labor.  She as admitted for therapeutic rest and observation.  Her prenatal history is significant for severe cholelithiasis and she is going to have a cholecystectomy postpartum.  PRENATAL LABORATORY DATA:     Hemoglobin 13, hematocrit 38.9, platelets 312,000. Blood type and Rh:  A-positive, Rh-antibodies negative.  VDRL nonreactive. Rubella titer immune.  Hepatitis B surface antigen negative.  Gonorrhea and Chlamydia cultures are negative.  Pap smears have been normal in the past.  Glucose challenge test at 28 weeks was 111.  Group beta strep at 36 weeks was negative.  MEDICAL HISTORY:              Severe gallbladder disease, which was exacerbated  during this pregnancy.  Laparotomy in February of 2000 for endometriosis. History of infertility; tried to conceive for 18 months.  Urinary tract infections in the past.  Wisdom tooth extraction and tonsillectomy in 1992.  Laparoscopy in February of 2000.  Toe surgery.  Hospitalized for dehydration in April.  Larey Seat off a horse in fourth grade; no residual problem.  ALLERGIES:                    No known drug allergies but severe environmental allergies to MOLDS, TREES, DUST and SHRIMP which causes anaphylaxis.  FAMILY HISTORY:               Maternal side significant for heart disease in maternal grandmother.  Paternal grandmother with CVAs.  Brother and mother with  hypertension, on medication.  Mother with emphysema; also a smoker.  Maternal grandmother and aunts with respiratory  problems.  Maternal grandmother with diet-controlled diabetes.  Brother with one kidney secondary to nephrectomy with severe infection.  Maternal grandmother with lupus.  Maternal grandfather with lymphoma.  GENETIC HISTORY:              Advanced maternal age with normal amniocentesis.   OBSTETRICAL HISTORY:          She is a primigravida.  SOCIAL HISTORY:               Caucasian.  [employer] religion.  Married to [employer].  College graduate.  Astronomer.  Husband is a Microbiologist.  Stable monogamous relationship.  Denies smoking, alcohol or drug abuse.  PHYSICAL EXAMINATION:  HEENT:                        Within normal limits.  LUNGS:                        Bilaterally clear.  HEART:  Regular rate and rhythm.  ABDOMEN:                      Soft, nontender.  Contractions every three minutes, moderate.  Fetal heart rate is reactive and reassuring.  PELVIC:                       Cervix 1.5 cm, 90% effaced, vertex at -1, slightly posterior.  Negative pooling.  Negative Nitrazine.  Negative fern.  EXTREMITIES:                  Trace edema.  DTRs +1.  ASSESSMENT:                   1. Prolonged prodromal labor, back labor.                               2. Negative group beta streptococcus.                               3. Advanced maternal age with normal amniocentesis.                               4. History of severe cholelithiasis.  PLAN:                         Admit to labor and delivery for therapeutic rest,  23-hour observation, medicate with morphine and Phenergan; after reviewing options, may also offer sterile water papules.  Will reexamine when she awakens and admit if greater-than-or-equal-to 3 cm. DD:  04/07/99 TD:  04/07/99 Job: [id number]

## 2010-10-01 NOTE — Discharge Summary (Signed)
[facility]  Patient:    Amy Rollins, Amy Rollins                  MRN: [id number] Adm. Date:  10/24/00 Disc. Date: 10/27/00 Attending:  Shaune Spittle Dictator:   Nigel Bridgeman, C.N.M.                           Discharge Summary  ADMITTING DIAGNOSES:          1. Intrauterine pregnancy at term.                               2. Previous cesarean section with desire for                                  repeat.                               3. Desires sterilization.  POSTOPERATIVE DIAGNOSES:      1. Intrauterine pregnancy at term.                               2. Previous cesarean section with desire for                                  repeat.                               3. Desires sterilization.  PROCEDURE:                    1. Repeat low transverse cesarean section with                                  bilateral tubal ligation.                               2. Spinal anesthesia.  HOSPITAL COURSE:              Ms. Amy Rollins is a 50 year old gravida 2, para 1-0-0-1 who is admitted at 39 weeks for scheduled repeat cesarean section. Her pregnancy has been remarkable for previous cesarean section with desire for repeat, desire for tubal sterilization, advanced maternal age with normal amniocentesis, positive group B strep.  On day of admission she was taken to the operating room where a repeat low transverse cesarean section and tubal ligation was performed by Dr. Dierdre Forth under spinal anesthesia. Estimated blood loss was less than 750 cc.  There were no complications. Findings were a viable female weighing 6 pounds 12 ounces.  Apgars were 8 and 9. Infant was taken to the full-term nursery.  Mother was taken to recovery room in good condition.  Her postoperative course was uncomplicated.  She was breast-feeding.  By postoperative day #1 hemoglobin was 10.5.  Her incision was clean, dry, and intact.  She was voiding without difficulty.  The  rest of her hospital course was uncomplicated as well.  She did feel  anxious about having her staples removed on the day of discharge given that her older son tends to like to jump on her abdomen.  Therefore, the decision was made to maintain her staples until October 31, 2000 where they will be removed in the office.  Patient was deemed to have received the full benefit of her hospital stay on October 27, 2000 and was discharged home.  DISCHARGE INSTRUCTIONS:       Per [facility] handout.  DISCHARGE MEDICATIONS:        1. Motrin 600 mg p.o. q.6h. p.r.n. pain.                               2. Tylox one to two p.o. q.3-4h. p.r.n. pain.                               3. Prenatal vitamins one p.o. q.d.  DISCHARGE FOLLOW-UP:          Six weeks [facility]. DD:  10/27/00 TD:  10/27/00 Job: [id number]

## 2010-10-01 NOTE — Op Note (Signed)
[facility]  Patient:    Amy Rollins, Amy Rollins                  MRN: [id number] Proc. Date: 10/24/00 Attending:  Erie Noe P. Pennie Rushing, M.D.                           Operative Report  PREOPERATIVE DIAGNOSES:       1. Intrauterine pregnancy at term.                               2. Prior cesarean section with desire for                                  repeat.                               3. Desire for surgical sterilization.  POSTOPERATIVE DIAGNOSES:      1. Intrauterine pregnancy at term.                               2. Prior cesarean section with desire for                                  repeat.                               3. Desire for surgical sterilization.  OPERATION:                    Repeat low transverse cesarean section.                               Bilateral tubal sterilization.  SURGEON:                      Vanessa P. Pennie Rushing, M.D.  ASSISTANT:                    Erin Sons, C.N.M.  ANESTHESIA:                   Spinal.  ESTIMATED BLOOD LOSS:         750 cc.  COMPLICATIONS:                None.  FINDINGS:                     The patient was delivered of a female infant weighing 6 pounds 12 ounces with Apgars of 8 and 9 at one and five minutes, respectively. The uterus, tubes, and ovaries were normal for the gravid state.  DESCRIPTION OF PROCEDURE:     The patient was taken to the operating room after appropriate identification and placed on the operating table. After placement of a spinal anesthetic, she was placed in the supine position with a left lateral tilt. The abdomen and perineum were prepped in multiple layers of Betadine. A Foley catheter was inserted into the bladder and connected to straight drainage. The abdomen was draped as  a sterile field. After assurance of adequate anesthesia, a transverse incision was made at the site of the previous cesarean section incision and the abdomen opened in layers.  The peritoneum was entered. The uterus was incised approximately 1 cm above the uterovesical fold, and the uterine cavity entered. The amniotic fluid was clear. The infant was delivered from the occiput transverse position with the aid of a Kiwi vacuum extractor, and after having the nares and pharynx suctioned and the cord clamped and cut, was handed off to the awaiting pediatricians. Appropriate cord blood was drawn and the placenta had detached from the uterus and was removed with gentle traction. The uterine incision was closed with a running interlocking suture of 0 Vicryl. An imbricating suture of 0 Vicryl was placed. A single hemostatic figure-of-eight suture was placed for adequate hemostasis. Copious irrigation was carried out. The left fallopian tube was then identified, followed to its fimbriated end, then grasped at the isthmic portion and elevated. A suture of 2-0 chromic was placed through the mesosalpinx and tied fore and aft on the knuckle of tube. A second ligature was placed proximal to that. The intervening knuckle of tube was excised and the cut ends cauterized. A similar procedure was carried out on the opposite side. Both portions of tube were removed from the operative field. Copious irrigation was carried out and hemostasis noted to be adequate. The abdominoperitoneum was closed with running suture of 2-0 Vicryl. The rectus muscles were reapproximated in the midline with a figure-of-eight suture of 2-0 Vicryl. The rectus muscles were irrigated and noted to be hemostatic. The rectus fascia was closed with a running suture of 0 Vicryl then reinforced on either side of midline with figure-of-eight sutures of 0 Vicryl. The subcutaneous tissue was irrigated and made hemostatic with Bovie cautery. Skin staples were applied. A sterile dressing was applied. The patient was taken from the operating room to the recovery room in satisfactory condition having tolerated the  procedure well with sponge and instrument counts correct. DD:  10/24/00 TD:  10/24/00 Job: [id number]

## 2010-10-01 NOTE — H&P (Signed)
[facility]  Patient:    MORENE, CECILIO                  MRN: [id number] Adm. Date:  10/24/00 Attending:  Shaune Spittle Dictator:   Nigel Bridgeman, C.N.M.                         History and Physical  DATE OF BIRTH:                July 30, 1960  HISTORY OF PRESENT ILLNESS:   Mrs. Buckles is a 50 year old, gravida 2, para 1-0-0-1 at 16 weeks who presents for scheduled repeat cesarean section and tubal ligation.  PREGNANCY REMARKABLE FOR:     1. Previous low transverse cesarean                                  section with a desire for repeat.                               2. Positive group B strep.                               3. Advanced maternal age with normal amnio.  PRENATAL LABORATORY DATA:     Blood type is A positive. Rh antibody negative. VDRL nonreactive. Rubella titer positive. Hepatitis B surface antigen negative. HIV was declined. Amnio was within normal limits. Toxoplasmosis titers were negative. Pap was normal. Hemoglobin upon entry into practice was 13.4. It was 11.4 at 27 weeks. Group B strep culture was positive at 36 weeks. EDC of October 30, 2000, was established by last menstrual period and was in agreement with ultrasound at approximately 18 weeks. GC and Chlamydia cultures were negative. Glucose challenge was normal. AFP was normal.  HISTORY OF PRESENT PREGNANCY:                    The patient entered care at approximately 7 weeks. She had had an amnio subsequent to that visit at approximately 16 weeks that was within normal limits. DD:  10/27/00 TD:  10/27/00 Job: [id number]

## 2010-10-01 NOTE — H&P (Signed)
[facility]  PatientReylene Rollins [facility]                       MRN: [id number] Adm. Date:  [id number] Attending:  Shaune Spittle Dictator:   Erin Sons, C.N.M.                         History and Physical  HISTORY OF PRESENT ILLNESS:   Ms. Record is a 50 year old gravida 1, para 0 t 40-2/7 weeks who presents to Maternity Admissions Unit with uterine contractions every three to three and a half minutes for the last several hours.  Patient has been seen several times over the last few days for prodromal labor.  Last cervix exam was 1.0 to 1.5 cm approximately two nights ago.  History has been remarkable for: #1 - Advanced maternal age with amniocentesis normal, #2 - history of infertility, #3 - history of anemia, #4 - rubella nonimmune, #5 - hyperemesis and weight loss, first trimester, #6 - gallbladder disease.  HISTORY OF PRESENT PREGNANCY:  Patient entered care at approximately 12 weeks. She planned an amniocentesis, which was performed by Erie Noe P. Haygood, M.D. at approximately 16 weeks.  Amniocentesis results were within normal limits.  She id have an upper respiratory tract infection at approximately 19 weeks.  The amniocentesis showed a normal female.  She began to have some upper abdominal pain at 23 weeks.  She was admitted to birthing suite at approximately 24 weeks for presumptive gallbladder disease and had elevated liver function tests.  The rest of her pregnancy, she had several gallbladder attacks but began to strictly adhere to fat-free disease.  She had an ultrasound at 32 weeks which showed normal growth and normal cervix.  She had a repeat ultrasound at 36 weeks which also showed good growth and normal fluid.  She was seen over the last one to two weeks in Maternity Admissions with similar pain; this did resolve itself with some pain medication. The rest of the patients pregnancy was uncomplicated.  She has  been seen several times over the last two to three days in Maternity Admissions for prodromal labor.  OBSTETRICAL HISTORY:          Patient is a primigravida.  MEDICAL HISTORY:              She was on Ortho-Novum 7/7/7 until approximately wo years ago.  She has used natural family planning since then.  She had a diagnostic laparoscopy in February of 2000 with endometriosis found.  She did have approximately 18 months of infertility time when she was trying to conceive and she was calculating basal body temperatures.  She has a history of frequent yeast infections as a teenager.  She reports the usual childhood illnesses.  She has  history of an intermittent anemia.  She had her last UTI a number of years ago.  She had her wisdom teeth removed in 1980, her tonsillectomy in 1992, her diagnostic laparoscopy in February of 2000 and toe surgery in the past.  She had been hospitalized for dehydration in April of 2000.  In the fourth grade, she fell off the house and was in the hospital.  ALLERGIES:                    Patient has no known medication allergies.  She is sensitive to MOLD, TREES, DUST and SHRIMP, which causes an anaphylactic shock.  FAMILY HISTORY:               On the maternal side, there are "a lot of heart problems."  Her maternal grandmother and paternal grandmother had strokes.  Her  brother and mother had medications for hypertension.  Her mother has emphysema nd a history of smoking.  Her maternal grandmother and maternal aunts have respiratory problems.  Her maternal grandmother is a diet-controlled diabetic.  Her brother had one kidney removed secondary to infection.  Her maternal grandmother had lupus.  Her maternal grandfather had lymphoma.  Her father is an alcoholic.  GENETIC HISTORY:              Remarkable for patients age of 31 at the time of delivery, with a normal amniocentesis performed.  SOCIAL HISTORY:               Patient is married to the  father of the baby; he s involved and supportive.  His name is Amy Rollins.  She is Caucasian and of the [employer].  She has been followed by the physicians service at [facility].  She denies any alcohol, drug or tobacco use since this pregnancy began.  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  Her husband is college-educated and is employed as a Human resources officer.  PHYSICAL EXAMINATION:  VITAL SIGNS:                  Stable.  Patient is afebrile.  HEENT:                        Within normal limits.  LUNGS:                        Bilateral breath sounds are clear.  HEART:                        Regular rate and rhythm without murmur.  BREASTS:                      Soft and nontender.  ABDOMEN:                      Fundal height is approximately 38 cm.  Estimated fetal weight is 7 to 7-1/2 pounds.  Uterine contractions are every three minutes, moderate quality.  PELVIC:                       Cervical exam 4+ cm, 100%, vertex at a -1 station  with bulging bag of water and moderate bloody show noted.  Fetal heart rate is reassuring per auscultation.  EXTREMITIES:                  Deep tendon reflexes are 2+ without clonus. There is a trace edema noted.  IMPRESSION:                   1. Intrauterine pregnancy at 40-2/7 weeks.                               2. Active labor.                               3. Negative group B streptococcus.  PLAN:                         1. Admit to birthing suite per consult with                                  Erie Noe P. Pennie Rushing, M.D. as attending physician.                               2. Routine physician orders.                               3. Plan PIH labs with routines secondary to history                                  of elevated liver function tests previously during                                  pregnancy. DD:  04/09/99 TD:  04/09/99 Job: [id number]

## 2010-10-01 NOTE — Discharge Summary (Signed)
[facility]  [facility]                       MRN: [id number] Adm. Date:  [id number] Disc. Date: 04/12/99 Attending:  Shaune Spittle Dictator:   Erin Sons, C.N.M.                           Discharge Summary  ADMITTING DIAGNOSES:          1. Intrauterine pregnancy at term.                               2. Active labor.                               3. History of gallstones.                               4. Rubella nonimmune.  DISCHARGE DIAGNOSES:          1. Term pregnancy.                               2. Meconium fluid.                               3. Nonreassuring tracing.                               4. Unsuccessful vacuum extraction.                               5. Failure to descend.  PROCEDURES:                   1. Pitocin augmentation.                               2. Epidural anesthesia.                               3. Attempted vacuum extraction.                               4. Primary low transverse cesarean section.                               5. Repair of vaginal laceration.  HOSPITAL COURSE:              Amy Rollins is a 50 year old gravida 1 para 0 at 26 and two-sevenths weeks, who presented with uterine contractions every two to  three minutes for the last several hours.  Pregnancy was remarkable for: 1. Prodromal labor for the last several days.  2. Gallstones, this pregnancy. 3. Advanced maternal age with normal amniocentesis.  4. History of infertility.  5. Hyperemesis.  6. Weight loss first trimester.  7. Rubella nonimmune.  On admission,  patient was 4 cm dilated, 90%, vertex, at a -1 station, with intact ag of water.  She was admitted for labor care.  Artificial rupture of membranes was accomplished by Dr. Pennie Rushing, with clear fluid noted.  Later in the morning, intrauterine pressure catheter and scalp lead were applied when the cervix was 5 cm.  She did have some variable  decelerations.  An amnioinfusion was performed for variable decelerations.  Patient progressed to completely dilated by the late evening, on November 24.  Fetal heart rate began to have nonreassuring components. Fetus was at a +2 station.  Vacuum-assisted vaginal birth was attempted, but was unsuccessful.  Therefore, a primary low transverse cesarean section was performed by Dr. Stefano Gaul, and repair of a vaginal laceration was also done, under epidural anesthesia.  Findings were a viable female by the name of Amy Rollins, weight 7 pounds  3 ounces, Apgars were 1, 6, and 6.  Normal uterus, tubes, and ovaries were noted. Estimated blood loss was 800 cc total.  Cord pH was 6.90.  Infant was taken to he NICU for an approximately 12 hour stay, but then was returned to the term nursery. By postoperative day #1, patient was doing well.  Her hemoglobin was 10.2, WBC count was 19.6.  She did have an itchy, erythematous rash on her abdomen.  She lso was noted to have hemorrhoids.  Hydrocortisone was given, to be placed on the abdomen, ______ for her hemorrhoids.  Postoperative day #2, patient was doing well.  Hemoglobin was 9.4.  Her WBC count was 17.6.  Her physical exam was within normal limits.  Her incision was normal.  She was having some slight incontinence when she walked around.  Kegel exercises were discussed.  Patient was deemed by  postoperative day #3 to have received the full benefit of her hospital stay. She was breast-feeding and the infant was doing well.  She was therefore discharged  home.  DISCHARGE INSTRUCTIONS:       Per [facility] handout.  DISCHARGE MEDICATIONS:        1. Motrin 600 mg p.o. q.6h. p.r.n. pain.                               2. Tylox 1-2 p.o. q.3-4h. p.r.n. pain.                               3. Prenatal vitamin 1 p.o. q.d.                               4. Chromagen Forte 1 p.o. b.i.d.                               5. Micronor 1 p.o.  q.d.  DISCHARGE FOLLOW-UP:          Will occur in six weeks at [facility]. DD:  04/12/99 TD:  04/12/99 Job: [id number]

## 2010-10-01 NOTE — H&P (Signed)
[facility]  Patient:    Amy Rollins, Amy Rollins                  MRN: [id number] Proc. Date: 10/27/00 Adm. Date:  10/24/00 Attending:  Shaune Spittle Dictator:   Nigel Bridgeman, C.N.M.                         History and Physical  HISTORY OF PRESENT ILLNESS:   The patient is a 50 year old, gravida 2, para 1-0-0-1, at 43 weeks, who presents for scheduled repeat cesarean section and tubal ligation.  Pregnancy has remarkable for:                               1. Previous low transverse cesarean section                                  with a desire for repeat.                               2. Positive group B strep.                               3. Advanced maternal age with normal                                  amniocentesis.  PRENATAL LABORATORY DATA:     Blood type is A positive.  Rh antibody negative. VDRL nonreactive.  Rubella titer positive.  Hepatitis B surface antigen negative.  GC and Chlamydia cultures were negative. Pap was normal.  Glucose challenge was normal.  Amniocentesis was normal.  Toxoplasmosis titers were negative.  AFP was normal.  Hemoglobin upon entry into practice I s 13.4; it was 11.4 at 27 weeks.  Group B strep culture was positive at 36 weeks.  EDC of October 30, 2000, was established by last menstrual period and was in agreement with ultrasound at approximately 18 weeks.  HISTORY OF PRESENT PREGNANCY:                    The patient entered care at approximately 15 weeks.  She did desire an amniocentesis.  This was done by Dr. Pennie Rushing at approximately 18 weeks.  The rest of her pregnancy was essentially uncomplicated.  She had another ultrasound at 35 weeks which showed normal fluid and normal growth.  PREVIOUS PREGNANCY HISTORY:   The patient had a primary low transverse cesarean section in November of 2000 for a female infant, weight 7 pounds 3 ounces at [redacted] week gestation.  She was in labor approximately four  days.  She had epidural anesthesia.  She had significant gallstones during the pregnancy. She progressed to completely dilated during her labor and she had an attempted vacuum-assisted vaginal birth that was unsuccessful, therefore, Dr. Stefano Gaul performed a primary low transverse cesarean section.  Her infant was in NICU for 24 hours and then stabilized and was able to go home with the patient. She did have some hyperemesis during that pregnancy.  She did also have some anemia during that pregnancy.  PAST MEDICAL HISTORY:  She was on Ortho-Novum 7/7/7 but stopped three years ago.  She had some type of abnormal Pap smear at age 6 but not sure what it showed.  She has diagnostic laparoscopy in February of 2000 for endometriosis.  She was treated for infertility prior to her year 2000 pregnancy, did not require any medication.  History of frequent yeast infections, age 10 and 28.  She reports the usual childhood illnesses.  She has had a history of intermittent anemia.  She had severe gallstone pain with her previous pregnancy.  She had UTI years ago.  PAST SURGICAL HISTORY:        Includes wisdom teeth removed in 1980, tonsillectomy in 1992, laparoscopic procedure in February of 2000, and toe surgery in the past, and she had her gallbladder removed in March of 2001. She was admitted for dehydration in April of 2000.  Also, in the 4th grade fell off a house and was admitted overnight.  ALLERGIES:                    She has no known medication allergies.  She is allergic to MOLD, TREES, DUST, SHRIMP, which causes an anaphylaxis.  FAMILY HISTORY:               Her sister had pregnancy-induced hypertension. The maternal side of her family has lots of heart problems.  Her maternal grandmother and paternal grandmother had strokes.  Her brother and mother are hypertensive, on medication.  Her sister also has anemia.  Her mother has emphysema from smoking.  Her maternal grandmother and  maternal aunt also had some type of respiratory problem.  Her maternal grandmother has a adult onset diabetic, diet controlled.  Her brother had one kidney with the other kidney removed secondary to infection.  Her father had a seizure x1 secondary to being struck by lightening.  Her maternal grandmother had lupus.  Her maternal grandfather had lymphoma.  Her maternal aunt had breast cancer.  Her father is an alcoholic.  Her father and mother are previous smokers.  Family history of is also remarkable for her older child being diagnosed with possible renal tubular acidosis as a child, but now has been cleared of that diagnosis.  GENETIC HISTORY:              Remarkable for the patient being age 31 at the time of delivery.  SOCIAL HISTORY:               The patient is married to the father of the baby.  He is involved and supportive.  His name is Genevie Cheshire.  The patient is college educated.  She is a Arts development officer.  Her husband is also college educated.  He is employed in Garment/textile technologist.  She has been followed by the [facility].  She denies any alcohol, drug or tobacco use during this pregnancy.  PHYSICAL EXAMINATION:  VITAL SIGNS:                  Vital signs are stable.  The patient is afebrile.  HEENT:                        Within normal limits.  LUNGS:                        Bilateral breath sounds are clear.  HEART:  Regular rate and rhythm without murmur.  BREASTS:                      Soft and nontender.  ABDOMEN:                      Fundal height is approximately 38 cm.  Estimated fetal weight is 7 to 7-1/2 pounds.  Uterine contractions are very occasional and mild.  Fetal heart rate is in the 140s by Doppler.  EXTREMITIES:                  Deep tendon reflexes are 2+ without clonus. There is a trace edema noted.  IMPRESSION:                   1. Intrauterine pregnancy, at 39 weeks.                               2.  Previous cesarean section with desire for                                  repeat.                                3. Desires tubal sterilization.                               4. Positive group B streptococcus.  PLAN:                         1. Admit to the [facility]                                  for consult with Dr. Dierdre Forth, who                                  is attending physician.                               2. Routine physician preop cesarean section                                  orders. DD:  10/27/00 TD:  10/27/00 Job: [id number]

## 2010-10-07 ENCOUNTER — Encounter: Payer: Self-pay | Admitting: Internal Medicine

## 2010-10-19 ENCOUNTER — Ambulatory Visit (INDEPENDENT_AMBULATORY_CARE_PROVIDER_SITE_OTHER): Payer: 59 | Admitting: Internal Medicine

## 2010-10-19 ENCOUNTER — Encounter: Payer: Self-pay | Admitting: Internal Medicine

## 2010-10-19 VITALS — BP 122/74 | HR 95 | Ht 64.0 in | Wt 216.0 lb

## 2010-10-19 DIAGNOSIS — J309 Allergic rhinitis, unspecified: Secondary | ICD-10-CM

## 2010-10-19 DIAGNOSIS — J45909 Unspecified asthma, uncomplicated: Secondary | ICD-10-CM

## 2010-10-19 MED ORDER — MONTELUKAST SODIUM 10 MG PO TABS: 10.0000 mg | ORAL_TABLET | Freq: Every day | ORAL | Status: DC

## 2010-10-19 MED ORDER — ALBUTEROL SULFATE HFA 108 (90 BASE) MCG/ACT IN AERS: 2.0000 | INHALATION_SPRAY | RESPIRATORY_TRACT | Status: DC | PRN

## 2010-10-19 NOTE — Assessment & Plan Note (Addendum)
Question a little residual sinusitis. Doubt important bacterial component so I won't add another antibiotic. Asked her to use Neti pot regularly for a few days.

## 2010-10-19 NOTE — Progress Notes (Signed)
  Subjective:    Patient ID: Amy Rollins, female    DOB: 03/02/1961, 50 y.o.   MRN: [id number]  HPI 10/19/10- 40 yoF followed for asthma, allergic rhinitis Last here July 01, 2010 - note reviewed.  Was sick with febrile illness in early May- we called in Z pak then Cefdinir. Still feeling a little diminished energy, some frontal and occipital headache, voice not strong. Scant pale yellow mucus. Minor wheeze is also improved. Didn't use her rescue inhaler.  Probable snoring and some reflux.   Review of Systems Constitutional:   No weight loss, night sweats,  Fevers, chills, fatigue, lassitude. HEENT:   No Difficulty swallowing,  Tooth/dental problems,  Sore throat,                No sneezing, itching, ear ache,    Some night sweats.   CV:  No chest pain,  Orthopnea, PND, swelling in lower extremities, anasarca, dizziness, palpitations  GI  No abdominal pain, nausea, vomiting, diarrhea, change in bowel habits, loss of appetite  Resp: No shortness of breath with exertion or at rest.  No excess mucus, no productive cough,  No non-productive cough,  No coughing up of blood.  No change in color of mucus.  No chest wall deformity  Skin: no rash or lesions.  GU: no dysuria, change in color of urine, no urgency or frequency.  No flank pain.  MS:  No joint pain or swelling.  No decreased range of motion.  No back pain.  Psych:  No change in mood or affect. No depression or anxiety.  No memory loss.      Objective:   Physical Exam General- Alert, Oriented, Affect-appropriate, Distress- none acute  overweight  Skin- rash-none, lesions- none, excoriation- none  Lymphadenopathy- none  Head- atraumatic  Eyes- Gross vision intact, PERRLA, conjunctivae clear secretions  Ears- Hearing, canals, Tm- normal  Nose- Clear, No- Septal dev, mucus, polyps, erosion, perforation   Throat- Mallampati II , mucosa clear , drainage- none, tonsils- atrophic  Neck- flexible , trachea midline,  no stridor , thyroid nl, carotid no bruit  Chest - symmetrical excursion , unlabored     Heart/CV- RRR , no murmur , no gallop  , no rub, nl s1 s2                     - JVD- none , edema- none, stasis changes- none, varices- none     Lung- clear to P&A, wheeze- trace, cough- none , dullness-none, rub- none     Chest wall- atraumatic, no scar  Abd- tender-no, distended-no, bowel sounds-present, HSM- no  Br/ Gen/ Rectal- Not done, not indicated  Extrem- cyanosis- none, clubbing, none, atrophy- none, strength- nl  Heavy legs  Neuro- grossly intact to observation         Assessment & Plan:

## 2010-10-19 NOTE — Patient Instructions (Addendum)
Use your Neti pot twice daily for the next week. See if that helps your sense of drainage, stuffiness, headache  Refill script for Proair rescue inhaler to use as a supplement to Advair, if needed for chest tightness, short of breath, cough- sent to [facility] [address] Refill  Script for Singulair- sent  For your sense of heart burn/ reflux, which can also cause hoarseness- try otc  Pepcid 20 mg, 1 daily before breakfast.   See what you think after 2-3 weeks

## 2010-10-19 NOTE — Assessment & Plan Note (Addendum)
Very minimal residual wheeze. She is back down to Advair 250 after using 500 through her spring exacerbation. She wants to minimize meds and i don't want to aggravate hoarseness, if inhaled steroid is contributing to that. Some GERD may also make her hoarse.

## 2011-02-17 ENCOUNTER — Other Ambulatory Visit (HOSPITAL_COMMUNITY): Payer: Self-pay | Admitting: Internal Medicine

## 2011-02-17 DIAGNOSIS — Z1231 Encounter for screening mammogram for malignant neoplasm of breast: Secondary | ICD-10-CM

## 2011-03-03 ENCOUNTER — Ambulatory Visit: Payer: 59

## 2011-03-23 ENCOUNTER — Ambulatory Visit (HOSPITAL_COMMUNITY): Payer: 59

## 2011-04-20 ENCOUNTER — Ambulatory Visit (HOSPITAL_COMMUNITY)
Admission: RE | Admit: 2011-04-20 | Discharge: 2011-04-20 | Disposition: A | Discharge status: Home / Self Care | Payer: 59 | Source: Ambulatory Visit | Attending: Internal Medicine | Admitting: Internal Medicine

## 2011-04-20 ENCOUNTER — Encounter: Payer: Self-pay | Admitting: Internal Medicine

## 2011-04-20 ENCOUNTER — Ambulatory Visit (INDEPENDENT_AMBULATORY_CARE_PROVIDER_SITE_OTHER): Payer: 59 | Admitting: Internal Medicine

## 2011-04-20 VITALS — BP 110/72 | HR 94 | Ht 64.0 in | Wt 213.4 lb

## 2011-04-20 DIAGNOSIS — Z23 Encounter for immunization: Secondary | ICD-10-CM

## 2011-04-20 DIAGNOSIS — J45909 Unspecified asthma, uncomplicated: Secondary | ICD-10-CM

## 2011-04-20 DIAGNOSIS — Z1231 Encounter for screening mammogram for malignant neoplasm of breast: Secondary | ICD-10-CM | POA: Insufficient documentation

## 2011-04-20 MED ORDER — ALBUTEROL SULFATE HFA 108 (90 BASE) MCG/ACT IN AERS: 2.0000 | INHALATION_SPRAY | RESPIRATORY_TRACT | Status: DC | PRN

## 2011-04-20 NOTE — Patient Instructions (Signed)
Script for [employer] rescue inhaler sent to [facility]  Flu vax  Please call as needed

## 2011-04-20 NOTE — Progress Notes (Signed)
   Patient ID: Amy Rollins, female    DOB: [birth year], 50 y.o.   MRN: [id number]  HPI 10/19/10- 49 yoFnever smoker followed for asthma, allergic rhinitis Last here July 01, 2010 - note reviewed.  Was sick with febrile illness in early May- we called in Z pak then Cefdinir. Still feeling a little diminished energy, some frontal and occipital headache, voice not strong. Scant pale yellow mucus. Minor wheeze is also improved. Didn't use her rescue inhaler.  Probable snoring and some reflux.   04/20/11- 49 yoF never smoker followed for asthma, allergic rhinitis Has not had flu vaccine yet but was recently exposed to a child with flu at Thanksgiving. Had some wheezing during the summer including to colds. Advair does help but she has not felt the need to use her rescue inhaler. Singulair is also considered a big help. She continues Nasonex and Zyrtec.  Review of Systems-see HPI Constitutional:   No-   weight loss, night sweats, fevers, chills, fatigue, lassitude. HEENT:   No-  headaches, difficulty swallowing, tooth/dental problems, sore throat,       No-  sneezing, itching, ear ache, nasal congestion, post nasal drip,  CV:  No-   chest pain, orthopnea, PND, swelling in lower extremities, anasarca,                                  dizziness, palpitations Resp: No-   shortness of breath with exertion or at rest.              No-   productive cough,  No non-productive cough,  No- coughing up of blood.              No-   change in color of mucus.  No- wheezing.   Skin: No-   rash or lesions.     Objective:   Physical Exam General- Alert, Oriented, Affect-appropriate, Distress- none acute, overweight Skin- rash-none, lesions- none, excoriation- none Lymphadenopathy- none Head- atraumatic            Eyes- Gross vision intact, PERRLA, conjunctivae clear secretions            Ears- Hearing, canals-normal            Nose- Clear, no-Septal dev, mucus, polyps, erosion, perforation   Throat- Mallampati II , mucosa clear , drainage- none, tonsils- atrophic Neck- flexible , trachea midline, no stridor , thyroid nl, carotid no bruit Chest - symmetrical excursion , unlabored           Heart/CV- RRR , no murmur , no gallop  , no rub, nl s1 s2                           - JVD- none , edema- none, stasis changes- none, varices- none           Lung- clear to P&A, wheeze- none, cough- none , dullness-none, rub- none           Chest wall-  Abd- tender-no, distended-no, bowel sounds-present, HSM- no Br/ Gen/ Rectal- Not done, not indicated Extrem- cyanosis- none, clubbing, none, atrophy- none, strength- nl Neuro- grossly intact to observation

## 2011-04-23 NOTE — Assessment & Plan Note (Signed)
Good control despite a couple of colds this summer. She has figured out which medicines works best for her. She does agree to get flu shot and to renew her rescue inhaler prescription.

## 2011-09-27 ENCOUNTER — Ambulatory Visit: Payer: 59 | Admitting: Internal Medicine

## 2011-09-27 ENCOUNTER — Telehealth: Payer: Self-pay | Admitting: Internal Medicine

## 2011-09-27 MED ORDER — PREDNISONE 10 MG PO TABS: ORAL_TABLET | ORAL | Status: DC

## 2011-09-27 NOTE — Telephone Encounter (Signed)
Per CY-okay to have patient start Prednisone 10 mg #20 take 4 x 2 days, 3 x 2 days, 2 x 2 days, 1 x 2 days, then stop no refills; also keep appt with CY Wed at 11am.

## 2011-09-27 NOTE — Telephone Encounter (Signed)
Called, spoke with pt.  I informed her of below per Dr. Maple Hudson.  She is aware pred taper sent to [facility] on [address] and to keep OV with CDY for tomorrow at 11 am.  She verbalized understanding of this.

## 2011-09-27 NOTE — Telephone Encounter (Signed)
Pt states she is having trouble keeping wheezing at bay; was exposed to URI last week;even using Advair patient is having chest tightness and wheezing. Pt was unable to keep her appt today due to having her son at his MD due to recent PNA. CY please advise if you are okay giving her RX for this for now. Thanks.

## 2011-09-28 ENCOUNTER — Ambulatory Visit (INDEPENDENT_AMBULATORY_CARE_PROVIDER_SITE_OTHER): Payer: 59 | Admitting: Internal Medicine

## 2011-09-28 ENCOUNTER — Encounter: Payer: Self-pay | Admitting: Internal Medicine

## 2011-09-28 VITALS — BP 110/74 | HR 101 | Ht 64.0 in | Wt 218.6 lb

## 2011-09-28 DIAGNOSIS — J309 Allergic rhinitis, unspecified: Secondary | ICD-10-CM

## 2011-09-28 DIAGNOSIS — J45909 Unspecified asthma, uncomplicated: Secondary | ICD-10-CM

## 2011-09-28 DIAGNOSIS — J302 Other seasonal allergic rhinitis: Secondary | ICD-10-CM

## 2011-09-28 MED ORDER — AZITHROMYCIN 250 MG PO TABS: ORAL_TABLET | ORAL | Status: AC

## 2011-09-28 MED ORDER — ALBUTEROL SULFATE (2.5 MG/3ML) 0.083% IN NEBU: 2.5000 mg | INHALATION_SOLUTION | Freq: Four times a day (QID) | RESPIRATORY_TRACT | Status: DC | PRN

## 2011-09-28 MED ORDER — LEVALBUTEROL HCL 0.63 MG/3ML IN NEBU
0.6300 mg | INHALATION_SOLUTION | Freq: Once | RESPIRATORY_TRACT | Status: AC
  Administered 2011-09-28: 0.63 mg via RESPIRATORY_TRACT

## 2011-09-28 MED ORDER — METHYLPREDNISOLONE ACETATE 80 MG/ML IJ SUSP
80.0000 mg | Freq: Once | INTRAMUSCULAR | Status: AC
  Administered 2011-09-28: 80 mg via INTRAMUSCULAR

## 2011-09-28 NOTE — Progress Notes (Signed)
Patient ID: Amy Rollins, female    DOB: 22-Sep-1960, 51 y.o.   MRN: [id number]  HPI 10/19/10- 51 yoFnever smoker followed for asthma, allergic rhinitis Last here July 01, 2010 - note reviewed.  Was sick with febrile illness in early May- we called in Z pak then Cefdinir. Still feeling a little diminished energy, some frontal and occipital headache, voice not strong. Scant pale yellow mucus. Minor wheeze is also improved. Didn't use her rescue inhaler.  Probable snoring and some reflux.   04/20/11- 51 yoF never smoker followed for asthma, allergic rhinitis Has not had flu vaccine yet but was recently exposed to a child with flu at Thanksgiving. Had some wheezing during the summer including to colds. Advair does help but she has not felt the need to use her rescue inhaler. Singulair is also considered a big help. She continues Nasonex and Zyrtec.  09/28/11- 51 yoF never smoker followed for asthma, allergic rhinitis Started Prednisone taper given 5/13-can tell some difference. Feels like "rocks" in chest-deep cough. Acute onset for 5 days ago of sore throat, headache but no fever. We had called in prednisone taper yesterday and she thinks it may be starting to help but she is still wheezing. She is going to a soccer tournament will be outdoors. Has wheezed some all spring.  Review of Systems-see HPI Constitutional:   No-   weight loss, night sweats, fevers, chills, fatigue, lassitude. HEENT:   + headaches, No difficulty swallowing, tooth/dental problems,  +sore throat,       No-  sneezing, itching, ear ache, nasal congestion, post nasal drip,  CV:  No-   chest pain, orthopnea, PND, swelling in lower extremities, anasarca, dizziness, palpitations Resp: No-   shortness of breath with exertion or at rest.              No-   productive cough,  No non-productive cough,  No- coughing up of blood.              No-   change in color of mucus.  + wheezing.   Skin: No-   rash or lesions.       Objective:   Physical Exam General- Alert, Oriented, Affect-appropriate, Distress- none acute, overweight Skin- rash-none, lesions- none, excoriation- none Lymphadenopathy- none Head- atraumatic            Eyes- Gross vision intact, PERRLA, conjunctivae clear secretions            Ears- Hearing, canals-normal            Nose- Clear, no-Septal dev, mucus, polyps, erosion, perforation             Throat- Mallampati II , mucosa clear , drainage- none, tonsils- atrophic Neck- flexible , trachea midline, no stridor , thyroid nl, carotid no bruit Chest - symmetrical excursion , unlabored           Heart/CV- RRR , no murmur , no gallop  , no rub, nl s1 s2                           - JVD- none , edema- none, stasis changes- none, varices- none           Lung- clear to P&A, wheeze+ mild, cough+ dry , dullness-none, rub- none           Chest wall-  Abd-  Br/ Gen/ Rectal- Not done, not indicated Extrem- cyanosis- none, clubbing, none, atrophy-  none, strength- nl Neuro- grossly intact to observation

## 2011-09-28 NOTE — Patient Instructions (Signed)
Neb xop 0.63  Depo 80  Script sent for Zpak to have available on your trip

## 2011-10-02 NOTE — Assessment & Plan Note (Signed)
Clear, of current discomfort may be viral. Plan-finished prednisone taper

## 2011-10-02 NOTE — Assessment & Plan Note (Signed)
This is probably a viral upper respiratory infection asthmatic bronchitis Plan-prednisone, Carrie a Z-Pak, nebulizer treatment, Depo-Medrol

## 2011-10-06 ENCOUNTER — Other Ambulatory Visit: Payer: Self-pay | Admitting: Pulmonary Disease

## 2011-10-06 MED ORDER — FLUTICASONE-SALMETEROL 250-50 MCG/DOSE IN AEPB: 1.0000 | INHALATION_SPRAY | Freq: Two times a day (BID) | RESPIRATORY_TRACT | Status: DC

## 2011-10-06 NOTE — Progress Notes (Signed)
advair refilled x 3

## 2011-10-31 ENCOUNTER — Other Ambulatory Visit (HOSPITAL_COMMUNITY): Payer: Self-pay | Admitting: Internal Medicine

## 2011-10-31 DIAGNOSIS — Z1231 Encounter for screening mammogram for malignant neoplasm of breast: Secondary | ICD-10-CM

## 2012-04-05 ENCOUNTER — Other Ambulatory Visit: Payer: Self-pay | Admitting: Pulmonary Disease

## 2012-04-09 ENCOUNTER — Telehealth: Payer: Self-pay | Admitting: Internal Medicine

## 2012-04-09 MED ORDER — MOMETASONE FUROATE 50 MCG/ACT NA SUSP: 2.0000 | Freq: Two times a day (BID) | NASAL | Status: DC

## 2012-04-09 NOTE — Telephone Encounter (Signed)
Pt aware RX for nasonex has been sent for 90 day and 30 day supply per pt request. Nothing further was needed

## 2012-04-18 ENCOUNTER — Other Ambulatory Visit: Payer: Self-pay | Admitting: Dermatology

## 2012-04-19 ENCOUNTER — Encounter: Payer: Self-pay | Admitting: Internal Medicine

## 2012-04-19 ENCOUNTER — Ambulatory Visit (INDEPENDENT_AMBULATORY_CARE_PROVIDER_SITE_OTHER): Payer: 59 | Admitting: Internal Medicine

## 2012-04-19 VITALS — BP 102/72 | HR 83 | Ht 63.0 in | Wt 214.0 lb

## 2012-04-19 DIAGNOSIS — J302 Other seasonal allergic rhinitis: Secondary | ICD-10-CM

## 2012-04-19 DIAGNOSIS — Z23 Encounter for immunization: Secondary | ICD-10-CM

## 2012-04-19 DIAGNOSIS — J309 Allergic rhinitis, unspecified: Secondary | ICD-10-CM

## 2012-04-19 DIAGNOSIS — J45998 Other asthma: Secondary | ICD-10-CM

## 2012-04-19 DIAGNOSIS — J45909 Unspecified asthma, uncomplicated: Secondary | ICD-10-CM

## 2012-04-19 MED ORDER — METHYLPREDNISOLONE ACETATE 80 MG/ML IJ SUSP
80.0000 mg | Freq: Once | INTRAMUSCULAR | Status: AC
  Administered 2012-04-19: 80 mg via INTRAMUSCULAR

## 2012-04-19 MED ORDER — ALBUTEROL SULFATE (2.5 MG/3ML) 0.083% IN NEBU: 2.5000 mg | INHALATION_SOLUTION | Freq: Four times a day (QID) | RESPIRATORY_TRACT | Status: DC | PRN

## 2012-04-19 MED ORDER — PHENYLEPHRINE HCL 1 % NA SOLN
1.0000 [drp] | Freq: Once | NASAL | Status: AC
  Administered 2012-04-19: 1 [drp] via NASAL

## 2012-04-19 NOTE — Patient Instructions (Addendum)
Neb neo nasal  Depo 80  Script for nebulizer solution  Suggest you get dust mite encasings for the pillows and mattresses at the house where you stayed  Flu vax  Schedule return for allergy skin testing. It will be important that you be off all antihistamines for 3 days before the testing. This can include cough syurups, otc sleep meds.

## 2012-04-19 NOTE — Progress Notes (Signed)
Patient ID: Amy Rollins, female    DOB: [birth year], 51 y.o.   MRN: [id number]  HPI 10/19/10- 51 yoFnever smoker followed for asthma, allergic rhinitis Last here July 01, 2010 - note reviewed.  Was sick with febrile illness in early May- we called in Z pak then Cefdinir. Still feeling a little diminished energy, some frontal and occipital headache, voice not strong. Scant pale yellow mucus. Minor wheeze is also improved. Didn't use her rescue inhaler.  Probable snoring and some reflux.   04/20/11- 51 yoF never smoker followed for asthma, allergic rhinitis Has not had flu vaccine yet but was recently exposed to a child with flu at Thanksgiving. Had some wheezing during the summer including to colds. Advair does help but she has not felt the need to use her rescue inhaler. Singulair is also considered a big help. She continues Nasonex and Zyrtec.  09/28/11- 51 yoF never smoker followed for asthma, allergic rhinitis Started Prednisone taper given 5/13-can tell some difference. Feels like "rocks" in chest-deep cough. Acute onset for 5 days ago of sore throat, headache but no fever. We had called in prednisone taper yesterday and she thinks it may be starting to help but she is still wheezing. She is going to a soccer tournament will be outdoors. Has wheezed some all spring.  04/19/12- 51 yoF never smoker followed for asthma, allergic rhinitis FOLLOWS FOR: increased wheezing; worse with activity. Has been in an old house and thinks this may have caused it.; nasal congestion and has had to use Proair inhaler more than usual Spent Thanksgiving her old family [place]house had been closed up. She routinely gets head and chest congestion there  but it is a family tradition. Not a cold. Singulair and Mucinex D. are not sufficient for nasal congestion now. Some wheeze. No headache her discharge. Using Advair, with rescue inhaler once daily. Asks about retesting her allergy status.  Review of  Systems-see HPI Constitutional:   No-   weight loss, night sweats, fevers, chills, fatigue, lassitude. HEENT:   + headaches, No difficulty swallowing, tooth/dental problems,  +sore throat,       No-  sneezing, itching, ear ache, +nasal congestion, post nasal drip,  CV:  No-   chest pain, orthopnea, PND, swelling in lower extremities, anasarca, dizziness, palpitations Resp: No-   shortness of breath with exertion or at rest.              No-   productive cough,  No non-productive cough,  No- coughing up of blood.              No-   change in color of mucus.  + wheezing.   Skin: No-   rash or lesions. Objective:   Physical Exam General- Alert, Oriented, Affect-appropriate, Distress- none acute, overweight Skin- rash-none, lesions- none, excoriation- none Lymphadenopathy- none Head- atraumatic            Eyes- Gross vision intact, PERRLA, conjunctivae clear secretions            Ears- Hearing, canals-normal            Nose- +mucus and turbinate edema, no-Septal dev,  polyps, erosion, perforation             Throat- Mallampati III , mucosa clear , drainage- none, tonsils- atrophic Neck- flexible , trachea midline, no stridor , thyroid nl, carotid no bruit Chest - symmetrical excursion , unlabored           Heart/CV- RRR ,  no murmur , no gallop  , no rub, nl s1 s2                           - JVD- none , edema- none, stasis changes- none, varices- none           Lung- clear to P&A, wheeze+ mild, cough+ dry , dullness-none, rub- none           Chest wall-  Abd-  Br/ Gen/ Rectal- Not done, not indicated Extrem- cyanosis- none, clubbing, none, atrophy- none, strength- nl Neuro- grossly intact to observation

## 2012-04-23 ENCOUNTER — Ambulatory Visit (HOSPITAL_COMMUNITY)
Admission: RE | Admit: 2012-04-23 | Discharge: 2012-04-23 | Disposition: A | Discharge status: Home / Self Care | Payer: 59 | Source: Ambulatory Visit | Attending: Internal Medicine | Admitting: Internal Medicine

## 2012-04-23 DIAGNOSIS — Z1231 Encounter for screening mammogram for malignant neoplasm of breast: Secondary | ICD-10-CM

## 2012-04-24 ENCOUNTER — Telehealth: Payer: Self-pay | Admitting: Internal Medicine

## 2012-04-24 MED ORDER — MONTELUKAST SODIUM 10 MG PO TABS: 10.0000 mg | ORAL_TABLET | Freq: Every day | ORAL | Status: DC

## 2012-04-24 NOTE — Telephone Encounter (Signed)
Called pt to let her know that I have sent over her prescription.

## 2012-04-27 DIAGNOSIS — C4330 Malignant melanoma of unspecified part of face: Secondary | ICD-10-CM | POA: Insufficient documentation

## 2012-04-28 NOTE — Assessment & Plan Note (Signed)
Plan nebulizer decongestant, and Depo-Medrol. Return for skin testing

## 2012-04-28 NOTE — Assessment & Plan Note (Signed)
History suggests significant allergen exposure, probably dust and mold, and the old house where she stayed. Plan-refill nebulizer solution. Depo-Medrol

## 2012-05-01 DIAGNOSIS — E669 Obesity, unspecified: Secondary | ICD-10-CM | POA: Insufficient documentation

## 2012-05-23 ENCOUNTER — Ambulatory Visit: Payer: 59 | Admitting: Internal Medicine

## 2012-06-27 ENCOUNTER — Telehealth: Payer: Self-pay | Admitting: Internal Medicine

## 2012-06-27 MED ORDER — FLUTICASONE-SALMETEROL 500-50 MCG/DOSE IN AEPB: 1.0000 | INHALATION_SPRAY | Freq: Two times a day (BID) | RESPIRATORY_TRACT | Status: DC

## 2012-06-27 MED ORDER — FLUTICASONE PROPIONATE 50 MCG/ACT NA SUSP: 2.0000 | Freq: Two times a day (BID) | NASAL | Status: DC

## 2012-06-27 NOTE — Telephone Encounter (Signed)
Pt returned call. She now also asks that a rx for ADVAIR  500 be called in as well for her asthma flare-up. Amy Rollins

## 2012-06-27 NOTE — Telephone Encounter (Signed)
Ok per CY to switch nasal steroid to [employer] instructions as Nasonex--2 sprays bid  x6-refils  Sent to [facility]. [address].   Message left that medication has been switched and called into pharmacy.  ______________________________________________________________________  Patient called back and stated that she needs an Advair 500-50 to help with asthma attack. Pt normally takes Advair 250-50 daily. Ok per CY to take Advair 500-50 daily until gone then restart the 250-50. Make sure rinsing mouth out WELL after each use.  Call us if any worsening symptoms.

## 2012-06-27 NOTE — Telephone Encounter (Signed)
Pt insurance is no longer covering nasonex --Needs a rx for flonase which ins will cover - [facility] [address]  Dr Maple Hudson please advise if okay to switch medication. Thanks.

## 2012-07-02 ENCOUNTER — Telehealth: Payer: Self-pay | Admitting: Internal Medicine

## 2012-07-02 MED ORDER — ALBUTEROL SULFATE (2.5 MG/3ML) 0.083% IN NEBU: 2.5000 mg | INHALATION_SOLUTION | Freq: Four times a day (QID) | RESPIRATORY_TRACT | Status: DC | PRN

## 2012-07-02 NOTE — Telephone Encounter (Signed)
Spoke with pt requesting rx for albuterol nebs  rx sent pt aware nothing further needed.

## 2012-07-02 NOTE — Addendum Note (Signed)
Addended by: Boone Master E on: 07/02/2012 05:54 PM   Modules accepted: Orders

## 2012-07-11 ENCOUNTER — Ambulatory Visit: Payer: 59 | Admitting: Internal Medicine

## 2012-07-18 ENCOUNTER — Ambulatory Visit: Payer: 59 | Admitting: Internal Medicine

## 2012-08-10 ENCOUNTER — Other Ambulatory Visit: Payer: Self-pay | Admitting: Dermatology

## 2012-08-15 ENCOUNTER — Ambulatory Visit: Payer: 59 | Admitting: Internal Medicine

## 2012-08-27 ENCOUNTER — Other Ambulatory Visit: Payer: Self-pay | Admitting: Dermatology

## 2012-08-27 ENCOUNTER — Telehealth: Payer: Self-pay | Admitting: Internal Medicine

## 2012-08-27 MED ORDER — FLUTICASONE-SALMETEROL 500-50 MCG/DOSE IN AEPB: 1.0000 | INHALATION_SPRAY | Freq: Two times a day (BID) | RESPIRATORY_TRACT | Status: DC

## 2012-08-27 MED ORDER — FLUTICASONE-SALMETEROL 250-50 MCG/DOSE IN AEPB: INHALATION_SPRAY | RESPIRATORY_TRACT | Status: DC

## 2012-08-27 NOTE — Telephone Encounter (Signed)
Unable to leave message vm full. Pt requesting rxs for  advair 250/50 advair 500/50 rx's sent to pharmacy .  Nothing further needed.

## 2012-09-05 ENCOUNTER — Telehealth: Payer: Self-pay | Admitting: Internal Medicine

## 2012-09-05 ENCOUNTER — Ambulatory Visit (INDEPENDENT_AMBULATORY_CARE_PROVIDER_SITE_OTHER): Payer: 59 | Admitting: Pulmonary Disease

## 2012-09-05 ENCOUNTER — Encounter: Payer: Self-pay | Admitting: Pulmonary Disease

## 2012-09-05 VITALS — BP 132/90 | HR 92 | Temp 98.1°F | Ht 64.0 in | Wt 214.2 lb

## 2012-09-05 DIAGNOSIS — J45901 Unspecified asthma with (acute) exacerbation: Secondary | ICD-10-CM | POA: Insufficient documentation

## 2012-09-05 DIAGNOSIS — J40 Bronchitis, not specified as acute or chronic: Secondary | ICD-10-CM

## 2012-09-05 DIAGNOSIS — J329 Chronic sinusitis, unspecified: Secondary | ICD-10-CM | POA: Insufficient documentation

## 2012-09-05 MED ORDER — METHYLPREDNISOLONE ACETATE 80 MG/ML IJ SUSP
120.0000 mg | Freq: Once | INTRAMUSCULAR | Status: AC
  Administered 2012-09-05: 120 mg via INTRAMUSCULAR

## 2012-09-05 MED ORDER — CEFDINIR 300 MG PO CAPS: 600.0000 mg | ORAL_CAPSULE | ORAL | Status: DC

## 2012-09-05 MED ORDER — PREDNISONE 10 MG PO TABS: ORAL_TABLET | ORAL | Status: DC

## 2012-09-05 NOTE — Assessment & Plan Note (Signed)
The patient is having diffuse bronchospasm on exam today, along with increasing shortness of breath.  We'll give her a steroid injection today, as well as a short prednisone taper.  She is to continue on her usual maintenance bronchodilators.

## 2012-09-05 NOTE — Telephone Encounter (Signed)
Pt returned call.  Pt will see KC today @ 4:00.  Pt stated nothing further needed at this time.  Amy Rollins

## 2012-09-05 NOTE — Progress Notes (Signed)
  Subjective:    Patient ID: Amy Rollins, female    DOB: 12/12/1960, 52 y.o.   MRN: [id number]  HPI The patient comes in today for an acute sick visit.  She is normally followed by Dr. Maple Hudson for asthma and allergic rhinitis.  She began to have a dry cough last week that has worsened over the weekend.  She also noticed sinus pressure with headaches and purulent secretions from her nose.  Starting this week, she began to get congested in her chest, and produced yellow mucus.  She has also had increased wheezing and increased shortness of breath.   Review of Systems  Constitutional: Positive for fever ( low grade). Negative for unexpected weight change.  HENT: Positive for congestion, rhinorrhea, sneezing, postnasal drip and sinus pressure. Negative for ear pain, nosebleeds, sore throat, trouble swallowing and dental problem.        Light yellow mucus   Eyes: Negative for redness and itching.  Respiratory: Positive for cough and shortness of breath. Negative for chest tightness and wheezing.   Cardiovascular: Negative for palpitations and leg swelling.  Gastrointestinal: Negative for nausea and vomiting.  Genitourinary: Negative for dysuria.  Musculoskeletal: Negative for joint swelling.  Skin: Negative for rash.  Neurological: Positive for headaches.  Hematological: Does not bruise/bleed easily.  Psychiatric/Behavioral: Negative for dysphoric mood. The patient is not nervous/anxious.        Objective:   Physical Exam Overweight female in no acute distress Nose with mild erythema and edema, no purulence noted Oropharynx clear Neck without lymphadenopathy or thyromegaly Chest with mild upper airway pseudo wheezing, but prominent true wheezing in the lower airways.  Adequate air flow noted Cardiac exam with regular rate and rhythm Lower extremities with mild edema,  No cyanosis Alert and oriented, moves all 4 extremities.       Assessment & Plan:

## 2012-09-05 NOTE — Patient Instructions (Addendum)
Will treat you with omnicef 300mg , take 2 each am for 5 days for your respiratory infection.  Will treat you with a short course of prednisone. followup with Dr. Maple Hudson as scheduled, but let us know if not improving.

## 2012-09-05 NOTE — Telephone Encounter (Signed)
LMTCB-can offer [facility] appointment today at 4pm if she cant wait; otherwise she can come in to see CY tomorrow 09-06-12 at 11:30am.

## 2012-09-05 NOTE — Assessment & Plan Note (Signed)
The patient has been having increased sinus and chest congestion along with purulent material from both locations.  I will treat her with a course of antibiotics to help with resolution.

## 2013-01-07 ENCOUNTER — Ambulatory Visit (INDEPENDENT_AMBULATORY_CARE_PROVIDER_SITE_OTHER): Payer: 59 | Admitting: Internal Medicine

## 2013-01-07 ENCOUNTER — Encounter: Payer: Self-pay | Admitting: Internal Medicine

## 2013-01-07 ENCOUNTER — Telehealth: Payer: Self-pay | Admitting: Internal Medicine

## 2013-01-07 VITALS — BP 118/64 | HR 86 | Ht 63.0 in | Wt 216.4 lb

## 2013-01-07 DIAGNOSIS — J45901 Unspecified asthma with (acute) exacerbation: Secondary | ICD-10-CM

## 2013-01-07 MED ORDER — CLARITHROMYCIN 500 MG PO TABS: 500.0000 mg | ORAL_TABLET | Freq: Two times a day (BID) | ORAL | Status: AC

## 2013-01-07 NOTE — Progress Notes (Signed)
Patient ID: Amy Rollins, female    DOB: 06/06/1960, 52 y.o.   MRN: [id number]  HPI 10/19/10- 52 yoFnever smoker followed for asthma, allergic rhinitis Last here July 01, 2010 - note reviewed.  Was sick with febrile illness in early May- we called in Z pak then Cefdinir. Still feeling a little diminished energy, some frontal and occipital headache, voice not strong. Scant pale yellow mucus. Minor wheeze is also improved. Didn't use her rescue inhaler.  Probable snoring and some reflux.   04/20/11- 52 yoF never smoker followed for asthma, allergic rhinitis Has not had flu vaccine yet but was recently exposed to a child with flu at Thanksgiving. Had some wheezing during the summer including to colds. Advair does help but she has not felt the need to use her rescue inhaler. Singulair is also considered a big help. She continues Nasonex and Zyrtec.  09/28/11- 52 yoF never smoker followed for asthma, allergic rhinitis Started Prednisone taper given 5/13-can tell some difference. Feels like "rocks" in chest-deep cough. Acute onset for 5 days ago of sore throat, headache but no fever. We had called in prednisone taper yesterday and she thinks it may be starting to help but she is still wheezing. She is going to a soccer tournament will be outdoors. Has wheezed some all spring.  04/19/12- 52 yoF never smoker followed for asthma, allergic rhinitis FOLLOWS FOR: increased wheezing; worse with activity. Has been in an old house and thinks this may have caused it.; nasal congestion and has had to use Proair inhaler more than usual Spent Thanksgiving her old [place]house had been closed up. She routinely gets head and chest congestion there  but it is a family tradition. Not a cold. Singulair and Mucinex D. are not sufficient for nasal congestion now. Some wheeze. No headache her discharge. Using Advair, with rescue inhaler once daily. Asks about retesting her allergy status.  01/07/13- 52 yoF  never smoker followed for asthma, allergic rhinitis ACUTE VISIT: recent headcold 3-4 weeks ago; Thursday and Friday started hurting in Right side under breast and has now moved to her left side under breast; cough is productive-dark green in color; has been using plain Mucinex. Denies any fever or chills. GI okay. Mild DOE.  Review of Systems-see HPI Constitutional:   No-   weight loss, night sweats, fevers, chills, fatigue, lassitude. HEENT:   + headaches, No difficulty swallowing, tooth/dental problems,  +sore throat,       No-  sneezing, itching, ear ache, +nasal congestion, post nasal drip,  CV:  No-   chest pain, orthopnea, PND, swelling in lower extremities, anasarca, dizziness, palpitations Resp: No-   shortness of breath with exertion or at rest.             + productive cough,  No non-productive cough,  No- coughing up of blood.              + change in color of mucus.  + wheezing.   Skin: No-   rash or lesions. Objective:   Physical Exam General- Alert, Oriented, Affect-appropriate, Distress- none acute, overweight Skin- rash-none, lesions- none, excoriation- none Lymphadenopathy- none Head- atraumatic            Eyes- Gross vision intact, PERRLA, conjunctivae clear secretions            Ears- Hearing, canals-normal            Nose- +mucus and turbinate edema, no-Septal dev,  polyps, erosion, perforation  Throat- Mallampati III , mucosa clear , drainage- none, tonsils- atrophic Neck- flexible , trachea midline, no stridor , thyroid nl, carotid no bruit Chest - symmetrical excursion , unlabored           Heart/CV- RRR , no murmur , no gallop  , no rub, nl s1 s2                           - JVD- none , edema- none, stasis changes- none, varices- none           Lung- clear to P&A, wheeze+ mild, cough+ dry , dullness-none, rub- none           Chest wall-  Abd-  Br/ Gen/ Rectal- Not done, not indicated Extrem- cyanosis- none, clubbing, none, atrophy- none, strength-  nl Neuro- grossly intact to observation

## 2013-01-07 NOTE — Patient Instructions (Addendum)
Script sent for Biaxin/ clarithromycin- take after meals  Fluids, reasonable rest/ pace yourself, and mucinex  Please call as needed

## 2013-01-07 NOTE — Telephone Encounter (Signed)
Appt set for today at 11am, ok per [facility]. Carron Curie, CMA

## 2013-01-15 ENCOUNTER — Telehealth: Payer: Self-pay | Admitting: Internal Medicine

## 2013-01-15 MED ORDER — FLUTICASONE PROPIONATE 50 MCG/ACT NA SUSP: 2.0000 | Freq: Two times a day (BID) | NASAL | Status: DC

## 2013-01-15 MED ORDER — MONTELUKAST SODIUM 10 MG PO TABS: 10.0000 mg | ORAL_TABLET | Freq: Every day | ORAL | Status: DC

## 2013-01-15 NOTE — Telephone Encounter (Signed)
Rx have been sent to [facility].

## 2013-01-19 ENCOUNTER — Encounter: Payer: Self-pay | Admitting: Internal Medicine

## 2013-01-19 NOTE — Assessment & Plan Note (Signed)
Acute asthmatic bronchitis. Plan-fluids, Biaxin, mucinex

## 2013-02-11 ENCOUNTER — Encounter: Payer: Self-pay | Admitting: Obstetrics & Gynecology

## 2013-02-12 ENCOUNTER — Ambulatory Visit (INDEPENDENT_AMBULATORY_CARE_PROVIDER_SITE_OTHER): Payer: Commercial Managed Care - PPO | Admitting: Obstetrics & Gynecology

## 2013-02-12 ENCOUNTER — Encounter: Payer: Self-pay | Admitting: Obstetrics & Gynecology

## 2013-02-12 VITALS — BP 126/84 | HR 60 | Resp 12 | Ht 63.25 in | Wt 210.6 lb

## 2013-02-12 DIAGNOSIS — Z0000 Encounter for general adult medical examination without abnormal findings: Secondary | ICD-10-CM

## 2013-02-12 DIAGNOSIS — Z01419 Encounter for gynecological examination (general) (routine) without abnormal findings: Secondary | ICD-10-CM

## 2013-02-12 LAB — HEMOGLOBIN, FINGERSTICK: Hemoglobin, fingerstick: 14.6 g/dL (ref 12.0–16.0)

## 2013-02-12 MED ORDER — AMOXICILLIN-POT CLAVULANATE 875-125 MG PO TABS: 1.0000 | ORAL_TABLET | Freq: Two times a day (BID) | ORAL | Status: DC

## 2013-02-12 MED ORDER — ESTRADIOL 0.1 MG/GM VA CREA: TOPICAL_CREAM | VAGINAL | Status: DC

## 2013-02-12 NOTE — Patient Instructions (Signed)

## 2013-02-12 NOTE — Progress Notes (Signed)
52 y.o. G2P2 MarriedCaucasianF here for annual exam.  Doing well.  Boys are both in middle [employer].  No vaginal bleeding in last year.    Upper respiratory infection in August.  Saw Dr. Maple Hudson.  Took Biaxin.  Still feeling some congestion.  Feeling very tight as well.  C/o vaginal dryness over last year.  Some pain with intercourse.  Would like to try something for this.    Patient's last menstrual period was 11/14/2011.          Sexually active: yes  The current method of family planning is tubal ligation.    Exercising: no  not regularly Smoker:  no  Health Maintenance: Pap:  02/09/12 WNL/negative HR HPV History of abnormal Pap:  no MMG:  04/23/12 normal  Colonoscopy:  9/11 repeat in 5-10 years, Dr. Kinnie Scales BMD:   5/10 TDaP:  2013 Screening Labs: fasting tomorrow, Hb today: 14.6, Urine today: not done   reports that she has never smoked. She has never used smokeless tobacco. She reports that  drinks alcohol. She reports that she does not use illicit drugs.  Past Medical History  Diagnosis Date  . Allergic rhinitis, cause unspecified   . Asthma   . Rhinosinusitis   . Anemia   . Anxiety and depression     with previous medication use    Past Surgical History  Procedure Laterality Date  . Tonsillectomy    . Cesarean section      x2  . Cholecystectomy    . Tubal ligation    . Left radius fracture    . Bunionectomy      left  . Hemorrhoid banding      Current Outpatient Prescriptions  Medication Sig Dispense Refill  . Cyanocobalamin (VITAMIN B-12) 2500 MCG SUBL Place 1 tablet under the tongue daily.      . fluticasone (FLONASE) 50 MCG/ACT nasal spray Place 2 sprays into the nose 2 (two) times daily.  48 g  1  . Fluticasone-Salmeterol (ADVAIR DISKUS) 250-50 MCG/DOSE AEPB INHALE 1 PUFF BY MOUTH TWICE DAILY  180 each  0  . montelukast (SINGULAIR) 10 MG tablet Take 1 tablet (10 mg total) by mouth at bedtime.  90 tablet  1  . albuterol (PROAIR HFA) 108 (90 BASE)  MCG/ACT inhaler Inhale 2 puffs into the lungs every 4 (four) hours as needed for wheezing or shortness of breath.  1 Inhaler  prn  . albuterol (PROVENTIL) (2.5 MG/3ML) 0.083% nebulizer solution Take 3 mLs (2.5 mg total) by nebulization 4 (four) times daily as needed for wheezing. Dx: 493.10  75 mL  3   No current facility-administered medications for this visit.    Family History  Problem Relation Age of Onset  . Emphysema Mother   . Cancer Mother     lung  . ALS Father   . Other Brother     born with one kidney  . Lupus Maternal Grandmother   . Hypertension Mother   . Hypertension Brother     2  . Heart disease Mother   . Osteopenia Father     ROS:  Pertinent items are noted in HPI.  Otherwise, a comprehensive ROS was negative.  Exam:   BP 126/84  Pulse 60  Resp 12  Ht 5' 3.25" (1.607 m)  Wt 210 lb 9.6 oz (95.528 kg)  BMI 36.99 kg/m2  LMP 11/14/2011  Weight change: -6lbs   Height: 5' 3.25" (160.7 cm)  Ht Readings from Last 3 Encounters:  02/12/13 5' 3.25" (1.607 m)  01/07/13 5\' 3"  (1.6 m)  09/05/12 5\' 4"  (1.626 m)    General appearance: alert, cooperative and appears stated age Head: Normocephalic, without obvious abnormality, atraumatic Neck: no adenopathy, supple, symmetrical, trachea midline and thyroid normal to inspection and palpation Lungs: wheezes in all quadrants Breasts: normal appearance, no masses or tenderness Heart: regular rate and rhythm Abdomen: soft, non-tender; bowel sounds normal; no masses,  no organomegaly Extremities: extremities normal, atraumatic, no cyanosis or edema Skin: Skin color, texture, turgor normal. No rashes or lesions Lymph nodes: Cervical, supraclavicular, and axillary nodes normal. No abnormal inguinal nodes palpated Neurologic: Grossly normal   Pelvic: External genitalia:  no lesions              Urethra:  normal appearing urethra with no masses, tenderness or lesions              Bartholins and Skenes: normal                  Vagina: normal appearing vagina with normal color and discharge, no lesions              Cervix: no lesions              Pap taken: no Bimanual Exam:  Uterus:  normal size, contour, position, consistency, mobility, non-tender              Adnexa: normal adnexa and no mass, fullness, tenderness               Rectovaginal: Confirms               Anus:  normal sphincter tone, no lesions  A:  Well Woman with normal exam PMP Upper respiratory infection in pt with asthma Hemorrhoids, no issues in last year  P:   Mammogram yearly pap smear with neg HR HPV last year.  No Pap today. Augmentin 875mg  bid x 7 days to pharmacy Estrace vaginal cream 1 gm pv twice weekly.  Rx to pharmacy.  Pt to try for at least 6 weeks.  Risks of breast tenderness, cramping discussed.   Pt will return tomorrow for fasting lipids, CMP return annually or prn  An After Visit Summary was printed and given to the patient.

## 2013-02-13 ENCOUNTER — Other Ambulatory Visit (INDEPENDENT_AMBULATORY_CARE_PROVIDER_SITE_OTHER): Payer: Commercial Managed Care - PPO

## 2013-02-13 DIAGNOSIS — Z0000 Encounter for general adult medical examination without abnormal findings: Secondary | ICD-10-CM

## 2013-02-13 LAB — LIPID PANEL: Cholesterol: 209 mg/dL — ABNORMAL HIGH (ref 0–200)

## 2013-02-13 LAB — COMPREHENSIVE METABOLIC PANEL
Albumin: 3.9 g/dL (ref 3.5–5.2)
BUN: 10 mg/dL (ref 6–23)
CO2: 24 mEq/L (ref 19–32)
Calcium: 9 mg/dL (ref 8.4–10.5)
Chloride: 107 mEq/L (ref 96–112)
Glucose, Bld: 78 mg/dL (ref 70–99)
Potassium: 4.2 mEq/L (ref 3.5–5.3)
Sodium: 140 mEq/L (ref 135–145)
Total Protein: 6.4 g/dL (ref 6.0–8.3)

## 2013-02-14 ENCOUNTER — Encounter: Payer: Self-pay | Admitting: Obstetrics & Gynecology

## 2013-03-18 ENCOUNTER — Telehealth: Payer: Self-pay | Admitting: Internal Medicine

## 2013-03-18 MED ORDER — ALBUTEROL SULFATE (2.5 MG/3ML) 0.083% IN NEBU: 2.5000 mg | INHALATION_SOLUTION | Freq: Four times a day (QID) | RESPIRATORY_TRACT | Status: DC | PRN

## 2013-03-18 MED ORDER — ALBUTEROL SULFATE HFA 108 (90 BASE) MCG/ACT IN AERS: 2.0000 | INHALATION_SPRAY | RESPIRATORY_TRACT | Status: DC | PRN

## 2013-03-18 NOTE — Telephone Encounter (Signed)
RX's have been sent in for pt. Nothing further needed

## 2013-03-21 ENCOUNTER — Other Ambulatory Visit: Payer: Self-pay

## 2013-03-27 ENCOUNTER — Ambulatory Visit: Payer: Self-pay | Admitting: Obstetrics & Gynecology

## 2013-03-29 ENCOUNTER — Ambulatory Visit: Payer: Self-pay | Admitting: Obstetrics & Gynecology

## 2013-04-02 ENCOUNTER — Telehealth: Payer: Self-pay | Admitting: Internal Medicine

## 2013-04-02 MED ORDER — FLUTICASONE-SALMETEROL 250-50 MCG/DOSE IN AEPB: INHALATION_SPRAY | RESPIRATORY_TRACT | Status: DC

## 2013-04-03 NOTE — Telephone Encounter (Signed)
Please have patient come in tomorrow to see CY; there are a few held slots in the morning. Thanks.

## 2013-04-03 NOTE — Telephone Encounter (Signed)
I called and spoke with pt. Advised her RX was sent in yesterday for the advair. Pt reports she has been wheezing off and on since September. She saw her OBGYN and was giving augmentin. She thinks she needs a steriod shot to help clear her up for a couple months. Please advise Dr. Maple Hudson thanks Last OV 01/07/13 Pending 01/08/14 Allergies  Allergen Reactions  . Levofloxacin     REACTION: severe-hives,edema,throat closed up  . Pneumococcal Vaccines     Per MD-never take again; red area under and around arm

## 2013-04-03 NOTE — Telephone Encounter (Signed)
Called spoke with patient, offered ov with CDY tomorrow morning at 11am.  Pt okay with this date and time.  Appt scheduled and pt is aware to seek emergency care if her breathing worsens prior to appt.  Will sign off.

## 2013-04-04 ENCOUNTER — Ambulatory Visit (INDEPENDENT_AMBULATORY_CARE_PROVIDER_SITE_OTHER): Payer: 59 | Admitting: Internal Medicine

## 2013-04-04 ENCOUNTER — Ambulatory Visit (INDEPENDENT_AMBULATORY_CARE_PROVIDER_SITE_OTHER)
Admission: RE | Admit: 2013-04-04 | Discharge: 2013-04-04 | Disposition: A | Discharge status: Home / Self Care | Payer: 59 | Source: Ambulatory Visit | Attending: Internal Medicine | Admitting: Internal Medicine

## 2013-04-04 ENCOUNTER — Encounter: Payer: Self-pay | Admitting: Internal Medicine

## 2013-04-04 VITALS — BP 114/82 | HR 109 | Ht 63.0 in | Wt 205.4 lb

## 2013-04-04 DIAGNOSIS — J4541 Moderate persistent asthma with (acute) exacerbation: Secondary | ICD-10-CM

## 2013-04-04 DIAGNOSIS — J45909 Unspecified asthma, uncomplicated: Secondary | ICD-10-CM

## 2013-04-04 DIAGNOSIS — Z23 Encounter for immunization: Secondary | ICD-10-CM

## 2013-04-04 DIAGNOSIS — J45901 Unspecified asthma with (acute) exacerbation: Secondary | ICD-10-CM

## 2013-04-04 MED ORDER — LEVALBUTEROL HCL 0.63 MG/3ML IN NEBU
0.6300 mg | INHALATION_SOLUTION | Freq: Once | RESPIRATORY_TRACT | Status: AC
  Administered 2013-04-04: 0.63 mg via RESPIRATORY_TRACT

## 2013-04-04 MED ORDER — FLUTICASONE FUROATE-VILANTEROL 100-25 MCG/INH IN AEPB: 1.0000 | INHALATION_SPRAY | Freq: Every day | RESPIRATORY_TRACT | Status: DC

## 2013-04-04 MED ORDER — METHYLPREDNISOLONE ACETATE 80 MG/ML IJ SUSP
80.0000 mg | Freq: Once | INTRAMUSCULAR | Status: AC
  Administered 2013-04-04: 80 mg via INTRAMUSCULAR

## 2013-04-04 NOTE — Patient Instructions (Addendum)
Order- CXR   Dx asthma  Neb xop 063  Depo 80  Sample Breo Ellipta    1 puff then rinse mouth, ONE TIME DAILY    Use this instead of Advair. When you run out, go back to Advair  Flu vax

## 2013-04-04 NOTE — Progress Notes (Signed)
Patient ID: Amy Rollins, female    DOB: 02/06/1961, 52 y.o.   MRN: [id number]  HPI 10/19/10- 49 yoFnever smoker followed for asthma, allergic rhinitis Last here July 01, 2010 - note reviewed.  Was sick with febrile illness in early May- we called in Z pak then Cefdinir. Still feeling a little diminished energy, some frontal and occipital headache, voice not strong. Scant pale yellow mucus. Minor wheeze is also improved. Didn't use her rescue inhaler.  Probable snoring and some reflux.   04/20/11- 49 yoF never smoker followed for asthma, allergic rhinitis Has not had flu vaccine yet but was recently exposed to a child with flu at Thanksgiving. Had some wheezing during the summer including to colds. Advair does help but she has not felt the need to use her rescue inhaler. Singulair is also considered a big help. She continues Nasonex and Zyrtec.  09/28/11- 49 yoF never smoker followed for asthma, allergic rhinitis Started Prednisone taper given 5/13-can tell some difference. Feels like "rocks" in chest-deep cough. Acute onset for 5 days ago of sore throat, headache but no fever. We had called in prednisone taper yesterday and she thinks it may be starting to help but she is still wheezing. She is going to a soccer tournament will be outdoors. Has wheezed some all spring.  04/19/12- 49 yoF never smoker followed for asthma, allergic rhinitis FOLLOWS FOR: increased wheezing; worse with activity. Has been in an old house and thinks this may have caused it.; nasal congestion and has had to use Proair inhaler more than usual Spent Thanksgiving her old [place]house had been closed up. She routinely gets head and chest congestion there  but it is a family tradition. Not a cold. Singulair and Mucinex D. are not sufficient for nasal congestion now. Some wheeze. No headache her discharge. Using Advair, with rescue inhaler once daily. Asks about retesting her allergy status.  01/07/13- 49 yoF  never smoker followed for asthma, allergic rhinitis ACUTE VISIT: recent headcold 3-4 weeks ago; Thursday and Friday started hurting in Right side under breast and has now moved to her left side under breast; cough is productive-dark green in color; has been using plain Mucinex. Denies any fever or chills. GI okay. Mild DOE.  04/04/13-52 yoF never smoker followed for asthma, allergic rhinitis Follows for-persistent wheezing.  Pt's obgyn gave her augmentin, daily neb tx is not helping. This began with a cold in August. Wheeze is better but not gone. Biaxin did help. Her GYN then gave Augmentin. Much postnasal drip. Cough mostly clear sputum. Denies fever, sweat, adenopathy, chest pain. PFT: 01/28/2010 within normal limits with insignificant response to bronchodilator. Mild decrease in diffusion capacity. FVC 2.70/81%, FEV1 2.22/87%, FEV1/FEC 0.82. TLC 90%, DLCO 75%.  Review of Systems-see HPI Constitutional:   No-   weight loss, night sweats, fevers, chills, fatigue, lassitude. HEENT:   No- headaches, No difficulty swallowing, tooth/dental problems,  sore throat,       No-  sneezing, itching, ear ache, +nasal congestion, post nasal drip,  CV:  No-   chest pain, orthopnea, PND, swelling in lower extremities, anasarca, dizziness, palpitations Resp: No-   shortness of breath with exertion or at rest.             + productive cough,  No non-productive cough,  No- coughing up of blood.              No-change in color of mucus.  No- wheezing.   Skin: No-  rash or lesions. Objective:   Physical Exam General- Alert, Oriented, Affect-appropriate, Distress- none acute, overweight Skin- rash-none, lesions- none, excoriation- none Lymphadenopathy- none Head- atraumatic            Eyes- Gross vision intact, PERRLA, conjunctivae clear secretions            Ears- Hearing, canals-normal            Nose- +mucus and turbinate edema, no-Septal dev,  polyps, erosion, perforation             Throat- Mallampati  III , mucosa clear , drainage- none, tonsils- atrophic Neck- flexible , trachea midline, no stridor , thyroid nl, carotid no bruit Chest - symmetrical excursion , unlabored           Heart/CV- RRR , no murmur , no gallop  , no rub, nl s1 s2                           - JVD- none , edema- none, stasis changes- none, varices- none           Lung- + diffuse wheeze, unlabored, cough+ dry , dullness-none, rub- none           Chest wall-  Abd-  Br/ Gen/ Rectal- Not done, not indicated Extrem- cyanosis- none, clubbing, none, atrophy- none, strength- nl Neuro- grossly intact to observation

## 2013-04-19 ENCOUNTER — Other Ambulatory Visit: Payer: Self-pay | Admitting: Obstetrics & Gynecology

## 2013-04-19 DIAGNOSIS — Z1231 Encounter for screening mammogram for malignant neoplasm of breast: Secondary | ICD-10-CM

## 2013-04-21 NOTE — Assessment & Plan Note (Signed)
Post viral asthmatic bronchitis Plan-chest x-ray, nebulizer Xopenex, Depo-Medrol. Try sample [facility]

## 2013-05-03 ENCOUNTER — Ambulatory Visit (HOSPITAL_COMMUNITY): Payer: 59

## 2013-05-14 ENCOUNTER — Encounter: Payer: Self-pay | Admitting: Internal Medicine

## 2013-05-21 ENCOUNTER — Ambulatory Visit (INDEPENDENT_AMBULATORY_CARE_PROVIDER_SITE_OTHER): Payer: 59 | Admitting: Internal Medicine

## 2013-05-21 ENCOUNTER — Encounter: Payer: Self-pay | Admitting: Internal Medicine

## 2013-05-21 VITALS — BP 130/84 | HR 98 | Ht 63.0 in | Wt 200.8 lb

## 2013-05-21 DIAGNOSIS — J309 Allergic rhinitis, unspecified: Secondary | ICD-10-CM

## 2013-05-21 DIAGNOSIS — J302 Other seasonal allergic rhinitis: Secondary | ICD-10-CM

## 2013-05-21 DIAGNOSIS — J3089 Other allergic rhinitis: Secondary | ICD-10-CM

## 2013-05-21 DIAGNOSIS — J45901 Unspecified asthma with (acute) exacerbation: Secondary | ICD-10-CM

## 2013-05-21 MED ORDER — LEVALBUTEROL HCL 0.63 MG/3ML IN NEBU
0.6300 mg | INHALATION_SOLUTION | Freq: Once | RESPIRATORY_TRACT | Status: AC
  Administered 2013-05-21: 0.63 mg via RESPIRATORY_TRACT

## 2013-05-21 MED ORDER — FLUTICASONE FUROATE-VILANTEROL 100-25 MCG/INH IN AEPB: 1.0000 | INHALATION_SPRAY | Freq: Every day | RESPIRATORY_TRACT | Status: DC

## 2013-05-21 MED ORDER — FLUTICASONE FUROATE-VILANTEROL 100-25 MCG/INH IN AEPB: INHALATION_SPRAY | RESPIRATORY_TRACT | Status: DC

## 2013-05-21 MED ORDER — LEVALBUTEROL HCL 0.63 MG/3ML IN NEBU: INHALATION_SOLUTION | RESPIRATORY_TRACT | Status: DC

## 2013-05-21 NOTE — Progress Notes (Signed)
Patient ID: Amy Rollins, female    DOB: [birth year], 53 y.o.   MRN: [id number]  HPI 10/19/10- 49 yoFnever smoker followed for asthma, allergic rhinitis Last here July 01, 2010 - note reviewed.  Was sick with febrile illness in early May- we called in Z pak then Cefdinir. Still feeling a little diminished energy, some frontal and occipital headache, voice not strong. Scant pale yellow mucus. Minor wheeze is also improved. Didn't use her rescue inhaler.  Probable snoring and some reflux.   04/20/11- 49 yoF never smoker followed for asthma, allergic rhinitis Has not had flu vaccine yet but was recently exposed to a child with flu at Thanksgiving. Had some wheezing during the summer including to colds. Advair does help but she has not felt the need to use her rescue inhaler. Singulair is also considered a big help. She continues Nasonex and Zyrtec.  09/28/11- 49 yoF never smoker followed for asthma, allergic rhinitis Started Prednisone taper given 5/13-can tell some difference. Feels like "rocks" in chest-deep cough. Acute onset for 5 days ago of sore throat, headache but no fever. We had called in prednisone taper yesterday and she thinks it may be starting to help but she is still wheezing. She is going to a soccer tournament will be outdoors. Has wheezed some all spring.  04/19/12- 33 yoF never smoker followed for asthma, allergic rhinitis FOLLOWS FOR: increased wheezing; worse with activity. Has been in an old house and thinks this may have caused it.; nasal congestion and has had to use Proair inhaler more than usual Spent Thanksgiving her old family [place]house had been closed up. She routinely gets head and chest congestion there  but it is a family tradition. Not a cold. Singulair and Mucinex D. are not sufficient for nasal congestion now. Some wheeze. No headache her discharge. Using Advair, with rescue inhaler once daily. Asks about retesting her allergy status.  01/07/13- 49 yoF  never smoker followed for asthma, allergic rhinitis ACUTE VISIT: recent headcold 3-4 weeks ago; Thursday and Friday started hurting in Right side under breast and has now moved to her left side under breast; cough is productive-dark green in color; has been using plain Mucinex. Denies any fever or chills. GI okay. Mild DOE.  04/04/13-52 yoF never smoker followed for asthma, allergic rhinitis Follows for-persistent wheezing.  Pt's obgyn gave her augmentin, daily neb tx is not helping. This began with a cold in August. Wheeze is better but not gone. Biaxin did help. Her GYN then gave Augmentin. Much postnasal drip. Cough mostly clear sputum. Denies fever, sweat, adenopathy, chest pain. PFT: 01/28/2010 within normal limits with insignificant response to bronchodilator. Mild decrease in diffusion capacity. FVC 2.70/81%, FEV1 2.22/87%, FEV1/FEC 0.82. TLC 90%, DLCO 75%.  1/61/5- 52 yoF never smoker followed for asthma, allergic rhinitis FOLLOWS FOR:  Breathing doing well.  Has had a cold with cough and yellow mucus x2 weeks.  Discuss Breo trial- did better, then went back to Advair. Bad cold at Christmas time with much wheeze but says she was "afraid of" the emergency room. Went to an TEFL teacher and got 3 bottles of mixed herbal products. She liked Xopenex nebulizer treatment here but dislikes using albuterol at home because it makes her jittery. CXR 04/04/13 IMPRESSION:  No active cardiopulmonary disease.  Electronically Signed  By: Kathreen Devoid  On: 04/04/2013 14:16  Review of Systems-see HPI Constitutional:   No-   weight loss, night sweats, fevers, chills, fatigue, lassitude. HEENT:   No- headaches,  No difficulty swallowing, tooth/dental problems,  sore throat,       No-  sneezing, itching, ear ache, +nasal congestion, post nasal drip,  CV:  No-   chest pain, orthopnea, PND, swelling in lower extremities, anasarca, dizziness, palpitations Resp: No-   shortness of breath with exertion  or at rest.             + productive cough,  No non-productive cough,  No- coughing up of blood.              No-change in color of mucus.  No- wheezing.   Skin: No-   rash or lesions. Objective:   Physical Exam General- Alert, Oriented, Affect-appropriate, Distress- none acute, overweight Skin- rash-none, lesions- none, excoriation- none Lymphadenopathy- none Head- atraumatic            Eyes- Gross vision intact, PERRLA, conjunctivae clear secretions            Ears- Hearing, canals-normal            Nose- +mucus and turbinate edema, no-Septal dev,  polyps, erosion, perforation             Throat- Mallampati III , mucosa clear , drainage- none, tonsils- atrophic Neck- flexible , trachea midline, no stridor , thyroid nl, carotid no bruit Chest - symmetrical excursion , unlabored           Heart/CV- RRR , no murmur , no gallop  , no rub, nl s1 s2                           - JVD- none , edema- none, stasis changes- none, varices- none           Lung- + diffuse wheeze, unlabored, cough+ with deep breath , dullness-none, rub- none           Chest wall-  Abd-  Br/ Gen/ Rectal- Not done, not indicated Extrem- cyanosis- none, clubbing, none, atrophy- none, strength- nl Neuro- grossly intact to observation

## 2013-05-21 NOTE — Patient Instructions (Signed)
Sample and script Breo Ellipta     1 puff then rinse, once daily    Price compare with advair  Script for Xopenex/ levalbuterol neb solution    Price compare with albuterol. We can't prior-authorize this to try to get your insurance to pay, so you would have to decide the price difference was worth paying out of pocket.  Ok to try the herbal products to see what you think.   Neb xop 0.63 today     Dx asthma exacerbation

## 2013-05-23 ENCOUNTER — Other Ambulatory Visit: Payer: Self-pay | Admitting: Obstetrics & Gynecology

## 2013-05-23 ENCOUNTER — Ambulatory Visit (HOSPITAL_COMMUNITY)
Admission: RE | Admit: 2013-05-23 | Discharge: 2013-05-23 | Disposition: A | Discharge status: Home / Self Care | Payer: 59 | Source: Ambulatory Visit | Attending: Obstetrics & Gynecology | Admitting: Obstetrics & Gynecology

## 2013-05-23 DIAGNOSIS — Z1231 Encounter for screening mammogram for malignant neoplasm of breast: Secondary | ICD-10-CM

## 2013-06-14 NOTE — Assessment & Plan Note (Signed)
Acute exacerbation of chronic asthma/bronchitis Plan-we discussed herbal medications. Prescription for Breo Ellipta to compare prices with Advair. Prescription for Xopenex which she can self pay if she wants.

## 2013-06-17 ENCOUNTER — Telehealth: Payer: Self-pay | Admitting: Internal Medicine

## 2013-06-17 NOTE — Telephone Encounter (Signed)
lmomtcb x1 

## 2013-06-18 NOTE — Telephone Encounter (Signed)
LMTC x 1  

## 2013-06-19 MED ORDER — IPRATROPIUM BROMIDE 0.02 % IN SOLN: 0.5000 mg | Freq: Four times a day (QID) | RESPIRATORY_TRACT | Status: DC | PRN

## 2013-06-19 NOTE — Telephone Encounter (Signed)
Called spoke with patient, advised her of CDY's recs about trying the ipratropium neb q6h prn.  Pt okay with trying this medication and will call back if she is unable to tolerate it well.  Rx sent to [facility].  Pt is aware.  Nothing further needed at this time, will sign off.

## 2013-06-19 NOTE — Telephone Encounter (Signed)
Per last OV: Sample and script Breo Ellipta 1 puff then rinse, once daily Price compare with advair  Script for Xopenex/ levalbuterol neb solution Price compare with albuterol. We can't prior-authorize this to try to get your insurance to pay, so you would have to decide the price difference was worth paying out of pocket.  The pt states that the Breo works a lot better for her then advair and she wants to continue on this but it is very expensive and she thinks it needs a PA. Also she states that the xopenex works better for her and does not cause her to have the shakes like albuterol. Pt is asking if we can please do a PA for these medications. Please advise. Arboles Bing, CMA Allergies  Allergen Reactions  . Levofloxacin     REACTION: severe-hives,edema,throat closed up  . Pneumococcal Vaccines     Per MD-never take again; red area under and around arm

## 2013-06-19 NOTE — Telephone Encounter (Signed)
Called [facility] where both rx's were sent at last [facility] with pharmacist Nada Boozer who reported that pt's insurance actually covers both medications, however they have very high tiers (unfortunately his system did not specify which tier) and with insurance deductibles being higher this year, medications are more expensive for pt's.  The Xopenex was even sent to insurance as the generic Levalbuterol, but still ranks at a higher tier costing pt approximately $200.  Called spoke with patient and advised her of the above conversation with [facility]  Pt was okay with this and verbalized her understanding.  Did inform pt that we recently received coupon cards for [facility] allowing pt to receive 12 months free of the medication thru commercial insurance.  Card placed up front for pt to pick up her convenience.  Pt aware will route to CDY for recs regarding the Xopenex.  Dr Annamaria Boots, the Levalbuterol is too expensive for pt but she does tolerate this better than the Albuterol.  A coupon card for the [facility] has been placed up front for pt.  Are there any alternatives to the nebulizer medication for pt?  Thank you.

## 2013-06-19 NOTE — Telephone Encounter (Signed)
Can we try for prior auth- xopenex- albuterol causes much nervous jitter                                          Breo- pt states it works much better for her than Advair.

## 2013-06-19 NOTE — Telephone Encounter (Signed)
If she doesn't have glaucoma, then we can try Rx ipratropium neb solution # 25 , 1 neb every 6 hours if needed. Try this from drug store, and call for larger script if she likes it.

## 2013-10-02 ENCOUNTER — Other Ambulatory Visit: Payer: Self-pay | Admitting: Internal Medicine

## 2013-10-02 MED ORDER — FLUTICASONE FUROATE-VILANTEROL 100-25 MCG/INH IN AEPB: INHALATION_SPRAY | RESPIRATORY_TRACT | Status: DC

## 2013-10-02 NOTE — Telephone Encounter (Signed)
Received fax from [facility] requesting a 90 day supply on pt's Breo Last ov 1.6.15, upcoming 8.26.15 w/ CDY Refill sent

## 2013-10-10 ENCOUNTER — Other Ambulatory Visit: Payer: Self-pay | Admitting: Internal Medicine

## 2013-10-10 MED ORDER — FLUTICASONE FUROATE-VILANTEROL 100-25 MCG/INH IN AEPB: 1.0000 | INHALATION_SPRAY | Freq: Every day | RESPIRATORY_TRACT | Status: DC

## 2013-10-15 ENCOUNTER — Other Ambulatory Visit: Payer: Self-pay | Admitting: Internal Medicine

## 2013-10-15 MED ORDER — FLUTICASONE FUROATE-VILANTEROL 100-25 MCG/INH IN AEPB: INHALATION_SPRAY | RESPIRATORY_TRACT | Status: DC

## 2014-01-08 ENCOUNTER — Ambulatory Visit: Payer: 59 | Admitting: Internal Medicine

## 2014-01-14 ENCOUNTER — Ambulatory Visit: Payer: 59 | Admitting: Internal Medicine

## 2014-01-14 ENCOUNTER — Encounter: Payer: Self-pay | Admitting: Internal Medicine

## 2014-01-14 VITALS — BP 124/76 | HR 76 | Ht 63.0 in | Wt 205.8 lb

## 2014-01-14 DIAGNOSIS — J45998 Other asthma: Secondary | ICD-10-CM

## 2014-01-14 MED ORDER — METHYLPREDNISOLONE ACETATE 80 MG/ML IJ SUSP
80.0000 mg | Freq: Once | INTRAMUSCULAR | Status: AC
  Administered 2014-01-14: 80 mg via INTRAMUSCULAR

## 2014-01-14 NOTE — Progress Notes (Signed)
Patient ID: Amy Rollins, female    DOB: 29-May-1960, 53 y.o.   MRN: [id number]  HPI 10/19/10- 49 yoFnever smoker followed for asthma, allergic rhinitis Last here July 01, 2010 - note reviewed.  Was sick with febrile illness in early May- we called in Z pak then Cefdinir. Still feeling a little diminished energy, some frontal and occipital headache, voice not strong. Scant pale yellow mucus. Minor wheeze is also improved. Didn't use her rescue inhaler.  Probable snoring and some reflux.   04/20/11- 49 yoF never smoker followed for asthma, allergic rhinitis Has not had flu vaccine yet but was recently exposed to a child with flu at Thanksgiving. Had some wheezing during the summer including to colds. Advair does help but she has not felt the need to use her rescue inhaler. Singulair is also considered a big help. She continues Nasonex and Zyrtec.  09/28/11- 49 yoF never smoker followed for asthma, allergic rhinitis Started Prednisone taper given 5/13-can tell some difference. Feels like "rocks" in chest-deep cough. Acute onset for 5 days ago of sore throat, headache but no fever. We had called in prednisone taper yesterday and she thinks it may be starting to help but she is still wheezing. She is going to a soccer tournament will be outdoors. Has wheezed some all spring.  04/19/12- 42 yoF never smoker followed for asthma, allergic rhinitis FOLLOWS FOR: increased wheezing; worse with activity. Has been in an old house and thinks this may have caused it.; nasal congestion and has had to use Proair inhaler more than usual Spent Thanksgiving her old family [place]house had been closed up. She routinely gets head and chest congestion there  but it is a family tradition. Not a cold. Singulair and Mucinex D. are not sufficient for nasal congestion now. Some wheeze. No headache her discharge. Using Advair, with rescue inhaler once daily. Asks about retesting her allergy status.  01/07/13- 49 yoF  never smoker followed for asthma, allergic rhinitis ACUTE VISIT: recent headcold 3-4 weeks ago; Thursday and Friday started hurting in Right side under breast and has now moved to her left side under breast; cough is productive-dark green in color; has been using plain Mucinex. Denies any fever or chills. GI okay. Mild DOE.  04/04/13-52 yoF never smoker followed for asthma, allergic rhinitis Follows for-persistent wheezing.  Pt's obgyn gave her augmentin, daily neb tx is not helping. This began with a cold in August. Wheeze is better but not gone. Biaxin did help. Her GYN then gave Augmentin. Much postnasal drip. Cough mostly clear sputum. Denies fever, sweat, adenopathy, chest pain. PFT: 01/28/2010 within normal limits with insignificant response to bronchodilator. Mild decrease in diffusion capacity. FVC 2.70/81%, FEV1 2.22/87%, FEV1/FEC 0.82. TLC 90%, DLCO 75%.  1/61/5- 52 yoF never smoker followed for asthma, allergic rhinitis FOLLOWS FOR:  Breathing doing well.  Has had a cold with cough and yellow mucus x2 weeks.  Discuss Breo trial- did better, then went back to Advair. Bad cold at Christmas time with much wheeze but says she was "afraid of" the emergency room. Went to an TEFL teacher and got 3 bottles of mixed herbal products. She liked Xopenex nebulizer treatment here but dislikes using albuterol at home because it makes her jittery. CXR 04/04/13 IMPRESSION:  No active cardiopulmonary disease.  Electronically Signed  By: Kathreen Devoid  On: 04/04/2013 14:16  01/14/14- 53 yoF never smoker followed for asthma, allergic rhinitis FOLLOWS FOR: allergies are flaring up at this time; slight wheezing. Was given  Prednisone rx for flare up(did not finish)last week of July.Wonders if she needs another rd or injection.     Review of Systems-see HPI Constitutional:   No-   weight loss, night sweats, fevers, chills, fatigue, lassitude. HEENT:   No- headaches, No difficulty swallowing,  tooth/dental problems,  sore throat,       No-  sneezing, itching, ear ache, +nasal congestion, post nasal drip,  CV:  No-   chest pain, orthopnea, PND, swelling in lower extremities, anasarca, dizziness, palpitations Resp: No-   shortness of breath with exertion or at rest.             + productive cough,  No non-productive cough,  No- coughing up of blood.              No-change in color of mucus.  No- wheezing.   Skin: No-   rash or lesions. Objective:   Physical Exam General- Alert, Oriented, Affect-appropriate, Distress- none acute, overweight Skin- rash-none, lesions- none, excoriation- none Lymphadenopathy- none Head- atraumatic            Eyes- Gross vision intact, PERRLA, conjunctivae clear secretions            Ears- Hearing, canals-normal            Nose- +mucus and turbinate edema, no-Septal dev,  polyps, erosion, perforation             Throat- Mallampati III , mucosa clear , drainage- none, tonsils- atrophic Neck- flexible , trachea midline, no stridor , thyroid nl, carotid no bruit Chest - symmetrical excursion , unlabored           Heart/CV- RRR , no murmur , no gallop  , no rub, nl s1 s2                           - JVD- none , edema- none, stasis changes- none, varices- none           Lung- + diffuse wheeze, unlabored, cough+ with deep breath , dullness-none, rub- none           Chest wall-  Abd-  Br/ Gen/ Rectal- Not done, not indicated Extrem- cyanosis- none, clubbing, none, atrophy- none, strength- nl Neuro- grossly intact to observation

## 2014-01-14 NOTE — Patient Instructions (Signed)
Depo 80  We have removed Advair from your list since you are using Breo  You can decide whether you want to keep albuterol or xopenex available as your nebulizer solution  Please call if we can help

## 2014-02-27 ENCOUNTER — Ambulatory Visit: Payer: Commercial Managed Care - PPO | Admitting: Obstetrics & Gynecology

## 2014-02-27 ENCOUNTER — Encounter: Payer: Self-pay | Admitting: Obstetrics & Gynecology

## 2014-03-17 ENCOUNTER — Encounter: Payer: Self-pay | Admitting: Internal Medicine

## 2014-03-21 ENCOUNTER — Ambulatory Visit (INDEPENDENT_AMBULATORY_CARE_PROVIDER_SITE_OTHER)
Admission: RE | Admit: 2014-03-21 | Discharge: 2014-03-21 | Disposition: A | Discharge status: Home / Self Care | Payer: 59 | Source: Ambulatory Visit | Attending: Internal Medicine | Admitting: Internal Medicine

## 2014-03-21 ENCOUNTER — Encounter: Payer: Self-pay | Admitting: Internal Medicine

## 2014-03-21 ENCOUNTER — Ambulatory Visit (INDEPENDENT_AMBULATORY_CARE_PROVIDER_SITE_OTHER): Payer: 59 | Admitting: Internal Medicine

## 2014-03-21 VITALS — BP 120/80 | HR 63 | Ht 63.0 in | Wt 205.2 lb

## 2014-03-21 DIAGNOSIS — J4531 Mild persistent asthma with (acute) exacerbation: Secondary | ICD-10-CM

## 2014-03-21 DIAGNOSIS — J309 Allergic rhinitis, unspecified: Secondary | ICD-10-CM

## 2014-03-21 DIAGNOSIS — J3089 Other allergic rhinitis: Secondary | ICD-10-CM

## 2014-03-21 DIAGNOSIS — J4530 Mild persistent asthma, uncomplicated: Secondary | ICD-10-CM

## 2014-03-21 DIAGNOSIS — J302 Other seasonal allergic rhinitis: Secondary | ICD-10-CM

## 2014-03-21 DIAGNOSIS — R0789 Other chest pain: Secondary | ICD-10-CM

## 2014-03-21 NOTE — Assessment & Plan Note (Signed)
Slowly better. Potentially broke ribs.  Plan- CXR, Flu vax

## 2014-03-21 NOTE — Assessment & Plan Note (Signed)
Mild persistent asthma. Plan- Increase inhaled steroid with sample Breo 200. Hope to avoid systemic steroid.

## 2014-03-21 NOTE — Progress Notes (Signed)
Patient ID: Amy Rollins, female    DOB: 29-May-1960, 53 y.o.   MRN: [id number]  HPI 10/19/10- 49 yoFnever smoker followed for asthma, allergic rhinitis Last here July 01, 2010 - note reviewed.  Was sick with febrile illness in early May- we called in Z pak then Cefdinir. Still feeling a little diminished energy, some frontal and occipital headache, voice not strong. Scant pale yellow mucus. Minor wheeze is also improved. Didn't use her rescue inhaler.  Probable snoring and some reflux.   04/20/11- 49 yoF never smoker followed for asthma, allergic rhinitis Has not had flu vaccine yet but was recently exposed to a child with flu at Thanksgiving. Had some wheezing during the summer including to colds. Advair does help but she has not felt the need to use her rescue inhaler. Singulair is also considered a big help. She continues Nasonex and Zyrtec.  09/28/11- 49 yoF never smoker followed for asthma, allergic rhinitis Started Prednisone taper given 5/13-can tell some difference. Feels like "rocks" in chest-deep cough. Acute onset for 5 days ago of sore throat, headache but no fever. We had called in prednisone taper yesterday and she thinks it may be starting to help but she is still wheezing. She is going to a soccer tournament will be outdoors. Has wheezed some all spring.  04/19/12- 42 yoF never smoker followed for asthma, allergic rhinitis FOLLOWS FOR: increased wheezing; worse with activity. Has been in an old house and thinks this may have caused it.; nasal congestion and has had to use Proair inhaler more than usual Spent Thanksgiving her old family [place]house had been closed up. She routinely gets head and chest congestion there  but it is a family tradition. Not a cold. Singulair and Mucinex D. are not sufficient for nasal congestion now. Some wheeze. No headache her discharge. Using Advair, with rescue inhaler once daily. Asks about retesting her allergy status.  01/07/13- 49 yoF  never smoker followed for asthma, allergic rhinitis ACUTE VISIT: recent headcold 3-4 weeks ago; Thursday and Friday started hurting in Right side under breast and has now moved to her left side under breast; cough is productive-dark green in color; has been using plain Mucinex. Denies any fever or chills. GI okay. Mild DOE.  04/04/13-52 yoF never smoker followed for asthma, allergic rhinitis Follows for-persistent wheezing.  Pt's obgyn gave her augmentin, daily neb tx is not helping. This began with a cold in August. Wheeze is better but not gone. Biaxin did help. Her GYN then gave Augmentin. Much postnasal drip. Cough mostly clear sputum. Denies fever, sweat, adenopathy, chest pain. PFT: 01/28/2010 within normal limits with insignificant response to bronchodilator. Mild decrease in diffusion capacity. FVC 2.70/81%, FEV1 2.22/87%, FEV1/FEC 0.82. TLC 90%, DLCO 75%.  1/61/5- 52 yoF never smoker followed for asthma, allergic rhinitis FOLLOWS FOR:  Breathing doing well.  Has had a cold with cough and yellow mucus x2 weeks.  Discuss Breo trial- did better, then went back to Advair. Bad cold at Christmas time with much wheeze but says she was "afraid of" the emergency room. Went to an TEFL teacher and got 3 bottles of mixed herbal products. She liked Xopenex nebulizer treatment here but dislikes using albuterol at home because it makes her jittery. CXR 04/04/13 IMPRESSION:  No active cardiopulmonary disease.  Electronically Signed  By: Kathreen Devoid  On: 04/04/2013 14:16  01/14/14- 53 yoF never smoker followed for asthma, allergic rhinitis FOLLOWS FOR: allergies are flaring up at this time; slight wheezing. Was given  Prednisone rx for flare up(did not finish)last week of July.Wonders if she needs another rd or injection.  03/21/14- 22 yoF never smoker followed for asthma, allergic rhinitis ACUTE VISIT: increased wheezing; fell 4 weeks ago and never got checked out-unsure if she cracked or  bruised her rib(s). She states she has had a hard time moving around and breathing. On Breo, has nebulizer. Wheeze since 2 weeks after last here. Fell 2 weeks ago, hitting L ribs against bathtub. Pain slowly better. R frontal HA, nasal congestion lately. She asks about need for Chest Ct because of persistent wheeze.  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  Review of Systems-see HPI Constitutional:   No-   weight loss, night sweats, fevers, chills, fatigue, lassitude. HEENT:   No- headaches, No difficulty swallowing, tooth/dental problems,  sore throat,       No-  sneezing, itching, ear ache, +nasal congestion, post nasal drip,  CV:  +chest pain, orthopnea, PND, swelling in lower extremities, anasarca, dizziness, palpitations Resp: No-   shortness of breath with exertion or at rest.             No- productive cough,  No non-productive cough,  No- coughing up of blood.              No-change in color of mucus.  + wheezing.   Skin: No-   rash or lesions. GI:  No-   heartburn, indigestion, abdominal pain, nausea, vomiting, diarrhea,                 change in bowel habits, loss of appetite GU: No-   dysuria, change in color of urine, no urgency or frequency.  No- flank pain. MS:  No-   joint pain or swelling.  No- decreased range of motion.  No- back pain. Neuro-     nothing unusual Psych:  No- change in mood or affect. No depression or anxiety.  No memory loss.  Objective:   Physical Exam General- Alert, Oriented, Affect-appropriate, Distress- none acute, overweight Skin- rash-none, lesions- none, excoriation- none Lymphadenopathy- none Head- atraumatic            Eyes- Gross vision intact, PERRLA, conjunctivae clear secretions            Ears- Hearing, canals-normal            Nose- + turbinate edema, no-Septal dev,  polyps, erosion, perforation             Throat- Mallampati III , mucosa clear , drainage- none, tonsils- atrophic Neck- flexible , trachea midline, no stridor ,  thyroid nl, carotid no bruit Chest - symmetrical excursion , unlabored           Heart/CV- RRR , no murmur , no gallop  , no rub, nl s1 s2                           - JVD- none , edema- none, stasis changes- none, varices- none           Lung- + mild wheeze, unlabored, cough-none , dullness-none, rub- none           Chest wall-  Abd-  Br/ Gen/ Rectal- Not done, not indicated Extrem- cyanosis- none, clubbing, none, atrophy- none, strength- nl Neuro- grossly intact to observation

## 2014-03-21 NOTE — Assessment & Plan Note (Addendum)
Persistent mild nasal congestion. Don't think she has  A sinus infection now, but discussed symptoms to watch.  Plan- sample Dymista

## 2014-03-21 NOTE — Patient Instructions (Signed)
Sample Breo Ellipta 200  1 puff then rinse mouth, once daily. Try this instead of Breo 100. Return to B-100 when you run out of the sample.  Order CXR with Right lateral rib details    - dx R lateral rib pain after fall, asthma mild persistent  Flu vax  Sample Dymista nasal spray   1-2 puffs each nostril once daily at bedtime. Try this instead of Flonase  Schedule allergy skin testing. We will ask you to avoid all antihistamines including Dymista, for 3 days before skin testing

## 2014-03-27 ENCOUNTER — Telehealth: Payer: Self-pay | Admitting: *Deleted

## 2014-03-27 DIAGNOSIS — J45998 Other asthma: Secondary | ICD-10-CM

## 2014-03-27 NOTE — Telephone Encounter (Signed)
Called to give patient results, doesn't understand why she is still wheezing.  Wants Dr. Janee Morn opinion as to what could be causing her to wheeze.

## 2014-03-27 NOTE — Telephone Encounter (Signed)
Please order PFT for dx Asthma moderate persistent

## 2014-03-28 NOTE — Telephone Encounter (Signed)
Spoke with the pt and notified of recs per CDY She agreed to have PFT  Appt was scheduled for 04/22/14

## 2014-03-28 NOTE — Telephone Encounter (Signed)
PT returned call & can be reached at [phone].  Amy Rollins

## 2014-03-28 NOTE — Telephone Encounter (Signed)
LMTCB  Want to inform her before ordering PFT  Will hold in triage since [place] created this

## 2014-03-28 NOTE — Telephone Encounter (Signed)
LMTCB

## 2014-04-14 ENCOUNTER — Telehealth: Payer: Self-pay | Admitting: Internal Medicine

## 2014-04-14 NOTE — Telephone Encounter (Signed)
Pt can be reached @ [phone]  Or [phone].Amy Rollins

## 2014-04-14 NOTE — Telephone Encounter (Signed)
Spoke with patient-states she was to have skin testing done this Wednesday at 2:30pm 04-16-14. I do not see her on the schedule for this and unsure what happened as far as scheduling. I have placed patient on schedule for skin testing; pt has not been on any antihistamines since Saturday night and has been cleared to have skin testing. Nothing more needed at this time.

## 2014-04-16 ENCOUNTER — Ambulatory Visit: Payer: 59 | Admitting: Internal Medicine

## 2014-04-22 ENCOUNTER — Telehealth: Payer: Self-pay | Admitting: Internal Medicine

## 2014-04-22 ENCOUNTER — Other Ambulatory Visit: Payer: Self-pay | Admitting: Internal Medicine

## 2014-04-22 MED ORDER — AZITHROMYCIN 250 MG PO TABS: 250.0000 mg | ORAL_TABLET | ORAL | Status: DC

## 2014-04-22 NOTE — Telephone Encounter (Signed)
Per CY: offer ZPAK Pt aware.  Nothing further needed.

## 2014-04-22 NOTE — Telephone Encounter (Signed)
Pt c/o cough x 1 week, grey/light yellow mucus, HA, chest congestion and throat clearing. Using Mucinex, Breo and nebulizers as needed.  Pt [facility]x [facility] Denies fever. Cancelled PFT for today. Will reschedule this once she gets better.   Please advise Dr Annamaria Boots. Thanks.  Allergies  Allergen Reactions  . Levofloxacin     REACTION: severe-hives,edema,throat closed up  . Pneumococcal Vaccines     Per MD-never take again; red area under and around arm   Current Outpatient Prescriptions on File Prior to Visit  Medication Sig Dispense Refill  . albuterol (PROAIR HFA) 108 (90 BASE) MCG/ACT inhaler Inhale 2 puffs into the lungs every 4 (four) hours as needed for wheezing or shortness of breath. 3 Inhaler 4  . Cyanocobalamin (VITAMIN B-12) 2500 MCG SUBL Place 1 tablet under the tongue daily.    . fluticasone (FLONASE) 50 MCG/ACT nasal spray Place 2 sprays into the nose 2 (two) times daily. 48 g 1  . Fluticasone Furoate-Vilanterol (BREO ELLIPTA) 100-25 MCG/INH AEPB 1 puff then rinse mouth, one time daily- maintenance 3 each 3  . ipratropium (ATROVENT) 0.02 % nebulizer solution Take 2.5 mLs (0.5 mg total) by nebulization every 6 (six) hours as needed for wheezing or shortness of breath. 62.5 mL 0  . levalbuterol (XOPENEX) 0.63 MG/3ML nebulizer solution 1 neb every 6 hours when needed 225 mL 3  . montelukast (SINGULAIR) 10 MG tablet Take 1 tablet (10 mg total) by mouth at bedtime. 90 tablet 1   No current facility-administered medications on file prior to visit.

## 2014-04-23 ENCOUNTER — Other Ambulatory Visit: Payer: Self-pay | Admitting: Internal Medicine

## 2014-04-28 ENCOUNTER — Encounter: Payer: Self-pay | Admitting: Internal Medicine

## 2014-05-12 ENCOUNTER — Telehealth: Payer: Self-pay | Admitting: Internal Medicine

## 2014-05-12 MED ORDER — PREDNISONE (PAK) 10 MG PO TABS: ORAL_TABLET | Freq: Every day | ORAL | Status: DC

## 2014-05-12 NOTE — Telephone Encounter (Signed)
Zpak rx sent on 04/22/14.  Pt reports she finished this x 2 wks ago. Still is "wheezing a lot" and still coughing with some yellow mucus.  Wheezing persists after using Breo.  Also reports increased SOB with any activity, uses albuterol hfa with a little relief, and reflux.  Is taking OTC acid reducer with some relief qhs.  Pt is requesting OV with Dr. Annamaria Boots -- please advise.  Thank you.  Last OV with Dr. Annamaria Boots: 03/21/14  Allergies  Allergen Reactions  . Levofloxacin     REACTION: severe-hives,edema,throat closed up  . Pneumococcal Vaccines     Per MD-never take again; red area under and around arm     Current Outpatient Prescriptions on File Prior to Visit  Medication Sig Dispense Refill  . albuterol (PROAIR HFA) 108 (90 BASE) MCG/ACT inhaler Inhale 2 puffs into the lungs every 4 (four) hours as needed for wheezing or shortness of breath. 3 Inhaler 4  . azithromycin (ZITHROMAX) 250 MG tablet Take 1 tablet (250 mg total) by mouth as directed. 6 tablet 0  . BREO ELLIPTA 100-25 MCG/INH AEPB INHALE 1 PUFF THEN RINSE MOUTH, ONE TIME DAILY- MAINTENANCE 180 each 1  . Cyanocobalamin (VITAMIN B-12) 2500 MCG SUBL Place 1 tablet under the tongue daily.    . fluticasone (FLONASE) 50 MCG/ACT nasal spray Place 2 sprays into the nose 2 (two) times daily. 48 g 1  . Fluticasone Furoate-Vilanterol (BREO ELLIPTA) 100-25 MCG/INH AEPB 1 puff then rinse mouth, one time daily- maintenance 3 each 3  . Fluticasone Furoate-Vilanterol 100-25 MCG/INH AEPB Inhale 1 puff then rinse mouth, one time daily 1 each 5  . ipratropium (ATROVENT) 0.02 % nebulizer solution Take 2.5 mLs (0.5 mg total) by nebulization every 6 (six) hours as needed for wheezing or shortness of breath. 62.5 mL 0  . levalbuterol (XOPENEX) 0.63 MG/3ML nebulizer solution 1 neb every 6 hours when needed 225 mL 3  . montelukast (SINGULAIR) 10 MG tablet Take 1 tablet (10 mg total) by mouth at bedtime. 90 tablet 1   No current facility-administered  medications on file prior to visit.

## 2014-05-12 NOTE — Telephone Encounter (Signed)
Next available with me or TP. If it will be a while, let me know and we will send something in.

## 2014-05-12 NOTE — Telephone Encounter (Signed)
Spoke with CY: okay for prednisone 10mg  #20, 4tabs x2d, 3x2, 2x2, 1x2 and stop.  Thanks.  Called spoke with patient and offered pred taper as directed by CY above.  Pt voiced her understanding but now pt believes that she should start with a higher dose, ie "start at 10 tablets and work my way down."  Did advise pt that this is a very high dose and is unlikely but pt is adamant about asking CY.  Sorry Dr Annamaria Boots, please advise thank you.

## 2014-05-12 NOTE — Telephone Encounter (Signed)
Pt aware of rec's per CY [facility]. Nothing further needed.

## 2014-05-12 NOTE — Telephone Encounter (Signed)
10 tabs is too much. Offer compromise- 10 mg-start with 6 tabs- 6 x 2 d, 5 x 2 d, 4 x 2 da, 3 x 2 d, 2 x 2 d,  1 x 2 d, # 73

## 2014-05-12 NOTE — Telephone Encounter (Signed)
Called spoke with patient and discussed CY's recommendations with her. CY with no openings in Jan TP's next available is next week and pt does not think she can wait that long  Pt is requesting recs over the phone > wonders if prednisone would be appropriate? Dr Annamaria Boots please advise, thank you.

## 2014-06-25 ENCOUNTER — Encounter: Payer: Self-pay | Admitting: Obstetrics & Gynecology

## 2014-07-04 ENCOUNTER — Telehealth: Payer: Self-pay | Admitting: Internal Medicine

## 2014-07-04 MED ORDER — PREDNISONE 10 MG PO TABS
ORAL_TABLET | ORAL | Status: DC
Start: 2014-07-04 — End: 2014-07-15

## 2014-07-04 MED ORDER — AZITHROMYCIN 250 MG PO TABS: ORAL_TABLET | ORAL | Status: DC

## 2014-07-04 NOTE — Telephone Encounter (Signed)
Offer prednisone 10 mg. # 30-  3 x 2 days, 2 x 2 days, then one daily until no longer needed or she runs out. Call if concerns. Is she asking also for Zpak- ok if she wants to try it.

## 2014-07-04 NOTE — Telephone Encounter (Signed)
Called and spoke to pt. Informed pt of the recs per CY. Rx's sent to preferred pharmacy. Pt verbalized understanding and denied any further questions or concerns at this time.

## 2014-07-04 NOTE — Telephone Encounter (Signed)
Patient says she cannot get rid of her cough, she finished a taper of prednisone last week, she says her chest is very tight, her cough is very deep.  She is not able to cough up anything.  She is having a lot of wheezing.  Patient is currently taking Mucinex.  Patient wants to know if it would be too soon to try another round of Prednisone.  She says Prednisone works well.  She said that she is currently taking Breo 100mg  and Dr. Annamaria Boots increased her Breo to 200mg  at one time and it helped a lot.  However, she said that her husband has a cough as well and was just given Zpak.  Please advise.  Last OV with Dr. Annamaria Boots 03/21/2014  Allergies  Allergen Reactions  . Levofloxacin     REACTION: severe-hives,edema,throat closed up  . Pneumococcal Vaccines     Per MD-never take again; red area under and around arm   Current Outpatient Prescriptions on File Prior to Visit  Medication Sig Dispense Refill  . albuterol (PROAIR HFA) 108 (90 BASE) MCG/ACT inhaler Inhale 2 puffs into the lungs every 4 (four) hours as needed for wheezing or shortness of breath. 3 Inhaler 4  . azithromycin (ZITHROMAX) 250 MG tablet Take 1 tablet (250 mg total) by mouth as directed. 6 tablet 0  . BREO ELLIPTA 100-25 MCG/INH AEPB INHALE 1 PUFF THEN RINSE MOUTH, ONE TIME DAILY- MAINTENANCE 180 each 1  . Cyanocobalamin (VITAMIN B-12) 2500 MCG SUBL Place 1 tablet under the tongue daily.    . fluticasone (FLONASE) 50 MCG/ACT nasal spray Place 2 sprays into the nose 2 (two) times daily. 48 g 1  . Fluticasone Furoate-Vilanterol (BREO ELLIPTA) 100-25 MCG/INH AEPB 1 puff then rinse mouth, one time daily- maintenance 3 each 3  . Fluticasone Furoate-Vilanterol 100-25 MCG/INH AEPB Inhale 1 puff then rinse mouth, one time daily 1 each 5  . ipratropium (ATROVENT) 0.02 % nebulizer solution Take 2.5 mLs (0.5 mg total) by nebulization every 6 (six) hours as needed for wheezing or shortness of breath. 62.5 mL 0  . levalbuterol (XOPENEX) 0.63 MG/3ML  nebulizer solution 1 neb every 6 hours when needed 225 mL 3  . montelukast (SINGULAIR) 10 MG tablet Take 1 tablet (10 mg total) by mouth at bedtime. 90 tablet 1  . predniSONE (STERAPRED UNI-PAK) 10 MG tablet Take by mouth daily. Take 6 tabs x 2 days, then 5 x 2 days, then 4 x 2 days, then 3 x 2 days, then 2 x 2 days, then 1 x 2 days then stop. 42 tablet 0   No current facility-administered medications on file prior to visit.

## 2014-07-15 ENCOUNTER — Encounter: Payer: Self-pay | Admitting: Internal Medicine

## 2014-07-15 ENCOUNTER — Ambulatory Visit (INDEPENDENT_AMBULATORY_CARE_PROVIDER_SITE_OTHER): Payer: 59 | Admitting: Internal Medicine

## 2014-07-15 VITALS — BP 108/68 | HR 90 | Ht 63.0 in | Wt 202.0 lb

## 2014-07-15 DIAGNOSIS — J45998 Other asthma: Secondary | ICD-10-CM

## 2014-07-15 MED ORDER — FLUTICASONE FUROATE-VILANTEROL 200-25 MCG/INH IN AEPB: 1.0000 | INHALATION_SPRAY | Freq: Every day | RESPIRATORY_TRACT | Status: DC

## 2014-07-15 MED ORDER — PREDNISONE 10 MG PO TABS: ORAL_TABLET | ORAL | Status: DC

## 2014-07-15 NOTE — Assessment & Plan Note (Addendum)
Moderate persistent asthma. She has cleared with prednisone, so this is not a fixed obstruction. Hope to help her get off prednisone. Plan- short pred taper then stop prednisone, change Breo to 200. Reschedule PFT Sample and script for Breo 200

## 2014-07-15 NOTE — Progress Notes (Signed)
Patient ID: Amy Rollins, female    DOB: [birth year], 54 y.o.   MRN: [id number]  HPI 10/19/10- 49 yoFnever smoker followed for asthma, allergic rhinitis Last here July 01, 2010 - note reviewed.  Was sick with febrile illness in early May- we called in Z pak then Cefdinir. Still feeling a little diminished energy, some frontal and occipital headache, voice not strong. Scant pale yellow mucus. Minor wheeze is also improved. Didn't use her rescue inhaler.  Probable snoring and some reflux.   04/20/11- 49 yoF never smoker followed for asthma, allergic rhinitis Has not had flu vaccine yet but was recently exposed to a child with flu at Thanksgiving. Had some wheezing during the summer including to colds. Advair does help but she has not felt the need to use her rescue inhaler. Singulair is also considered a big help. She continues Nasonex and Zyrtec.  09/28/11- 49 yoF never smoker followed for asthma, allergic rhinitis Started Prednisone taper given 5/13-can tell some difference. Feels like "rocks" in chest-deep cough. Acute onset for 5 days ago of sore throat, headache but no fever. We had called in prednisone taper yesterday and she thinks it may be starting to help but she is still wheezing. She is going to a soccer tournament will be outdoors. Has wheezed some all spring.  04/19/12- 80 yoF never smoker followed for asthma, allergic rhinitis FOLLOWS FOR: increased wheezing; worse with activity. Has been in an old house and thinks this may have caused it.; nasal congestion and has had to use Proair inhaler more than usual Spent Thanksgiving her old family [place]house had been closed up. She routinely gets head and chest congestion there  but it is a family tradition. Not a cold. Singulair and Mucinex D. are not sufficient for nasal congestion now. Some wheeze. No headache her discharge. Using Advair, with rescue inhaler once daily. Asks about retesting her allergy status.  01/07/13- 49 yoF  never smoker followed for asthma, allergic rhinitis ACUTE VISIT: recent headcold 3-4 weeks ago; Thursday and Friday started hurting in Right side under breast and has now moved to her left side under breast; cough is productive-dark green in color; has been using plain Mucinex. Denies any fever or chills. GI okay. Mild DOE.  04/04/13-52 yoF never smoker followed for asthma, allergic rhinitis Follows for-persistent wheezing.  Pt's obgyn gave her augmentin, daily neb tx is not helping. This began with a cold in August. Wheeze is better but not gone. Biaxin did help. Her GYN then gave Augmentin. Much postnasal drip. Cough mostly clear sputum. Denies fever, sweat, adenopathy, chest pain. PFT: 01/28/2010 within normal limits with insignificant response to bronchodilator. Mild decrease in diffusion capacity. FVC 2.70/81%, FEV1 2.22/87%, FEV1/FEC 0.82. TLC 90%, DLCO 75%.  1/61/5- 52 yoF never smoker followed for asthma, allergic rhinitis FOLLOWS FOR:  Breathing doing well.  Has had a cold with cough and yellow mucus x2 weeks.  Discuss Breo trial- did better, then went back to Advair. Bad cold at Christmas time with much wheeze but says she was "afraid of" the emergency room. Went to an TEFL teacher and got 3 bottles of mixed herbal products. She liked Xopenex nebulizer treatment here but dislikes using albuterol at home because it makes her jittery. CXR 04/04/13 IMPRESSION:  No active cardiopulmonary disease.  Electronically Signed  By: Kathreen Devoid  On: 04/04/2013 14:16  01/14/14- 53 yoF never smoker followed for asthma, allergic rhinitis FOLLOWS FOR: allergies are flaring up at this time; slight wheezing. Was given  Prednisone rx for flare up(did not finish)last week of July.Wonders if she needs another rd or injection.  03/21/14- 32 yoF never smoker followed for asthma, allergic rhinitis ACUTE VISIT: increased wheezing; fell 4 weeks ago and never got checked out-unsure if she cracked or  bruised her rib(s). She states she has had a hard time moving around and breathing. On Breo, has nebulizer. Wheeze since 2 weeks after last here. Fell 2 weeks ago, hitting L ribs against bathtub. Pain slowly better. R frontal HA, nasal congestion lately. She asks about need for Chest Ct because of persistent wheeze.  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  07/15/14- 53 yoF never smoker followed for asthma, allergic rhinitis FOLLOWS FOR: Pt still on 10 mg Prednisone. She has some wheezing, non-productive cough and sob with exertion. Pt denies chest tightness Wheeze had cleared with prednisone, but then she caught a new cold. Since December has been on prednisone 10 mg daily. Using nebulizer occasionally.   Review of Systems-see HPI Constitutional:   No-   weight loss, night sweats, fevers, chills, fatigue, lassitude. HEENT:   No- headaches, No difficulty swallowing, tooth/dental problems,  sore throat,       No-  sneezing, itching, ear ache, +nasal congestion, post nasal drip,  CV:  +chest pain, orthopnea, PND, swelling in lower extremities, anasarca, dizziness, palpitations Resp: No-   shortness of breath with exertion or at rest.             No- productive cough,  No non-productive cough,  No- coughing up of blood.              No-change in color of mucus.  + wheezing.   Skin: No-   rash or lesions. GI:  No-   heartburn, indigestion, abdominal pain, nausea, vomiting, GU:  MS:  No-   joint pain or swelling.  in. Neuro-     nothing unusual Psych:  No- change in mood or affect. No depression or anxiety.  No memory loss.  Objective:   Physical Exam General- Alert, Oriented, Affect-appropriate, Distress- none acute, overweight Skin- rash-none, lesions- none, excoriation- none Lymphadenopathy- none Head- atraumatic            Eyes- Gross vision intact, PERRLA, conjunctivae clear secretions            Ears- Hearing, canals-normal            Nose- + turbinate edema, no-Septal dev,   polyps, erosion, perforation             Throat- Mallampati III , mucosa clear , drainage- none, tonsils- atrophic Neck- flexible , trachea midline, no stridor , thyroid nl, carotid no bruit Chest - symmetrical excursion , unlabored           Heart/CV- RRR , no murmur , no gallop  , no rub, nl s1 s2                           - JVD- none , edema- none, stasis changes- none, varices- none           Lung- + mild wheeze upper chest, unlabored, cough-none , dullness-none, rub- none           Chest wall-  Abd-  Br/ Gen/ Rectal- Not done, not indicated Extrem- cyanosis- none, clubbing, none, atrophy- none, strength- nl Neuro- grossly intact to observation

## 2014-07-15 NOTE — Patient Instructions (Signed)
Prednisone taper- script printed  Sample and script to change Breo to 200( coupon if available),    1 puff then rinse mouth, once daily  Reschedule PFT dx asthma moderate persistent

## 2014-08-02 ENCOUNTER — Other Ambulatory Visit: Payer: Self-pay | Admitting: Internal Medicine

## 2014-09-26 ENCOUNTER — Other Ambulatory Visit: Payer: Self-pay | Admitting: Internal Medicine

## 2014-09-26 ENCOUNTER — Telehealth: Payer: Self-pay | Admitting: Internal Medicine

## 2014-09-26 MED ORDER — ALBUTEROL SULFATE HFA 108 (90 BASE) MCG/ACT IN AERS: 2.0000 | INHALATION_SPRAY | RESPIRATORY_TRACT | Status: DC | PRN

## 2014-09-26 NOTE — Telephone Encounter (Signed)
Rx has been sent in. Pt is aware. Nothing further was needed. 

## 2014-09-29 ENCOUNTER — Other Ambulatory Visit: Payer: Self-pay | Admitting: Internal Medicine

## 2014-10-15 ENCOUNTER — Encounter: Payer: Self-pay | Admitting: Internal Medicine

## 2014-10-15 ENCOUNTER — Ambulatory Visit (INDEPENDENT_AMBULATORY_CARE_PROVIDER_SITE_OTHER): Payer: 59 | Admitting: Internal Medicine

## 2014-10-15 VITALS — BP 120/60 | HR 70 | Ht 63.0 in | Wt 203.0 lb

## 2014-10-15 DIAGNOSIS — J4530 Mild persistent asthma, uncomplicated: Secondary | ICD-10-CM

## 2014-10-15 DIAGNOSIS — J329 Chronic sinusitis, unspecified: Secondary | ICD-10-CM

## 2014-10-15 DIAGNOSIS — J4540 Moderate persistent asthma, uncomplicated: Secondary | ICD-10-CM | POA: Diagnosis not present

## 2014-10-15 DIAGNOSIS — J40 Bronchitis, not specified as acute or chronic: Secondary | ICD-10-CM

## 2014-10-15 DIAGNOSIS — J309 Allergic rhinitis, unspecified: Secondary | ICD-10-CM | POA: Diagnosis not present

## 2014-10-15 DIAGNOSIS — J302 Other seasonal allergic rhinitis: Secondary | ICD-10-CM

## 2014-10-15 DIAGNOSIS — J3089 Other allergic rhinitis: Secondary | ICD-10-CM

## 2014-10-15 MED ORDER — UMECLIDINIUM-VILANTEROL 62.5-25 MCG/INH IN AEPB: 1.0000 | INHALATION_SPRAY | Freq: Every day | RESPIRATORY_TRACT | Status: DC

## 2014-10-15 MED ORDER — ALBUTEROL SULFATE HFA 108 (90 BASE) MCG/ACT IN AERS: INHALATION_SPRAY | RESPIRATORY_TRACT | Status: DC

## 2014-10-15 NOTE — Progress Notes (Signed)
Patient ID: Amy Rollins, female    DOB: 29-May-1960, 54 y.o.   MRN: [id number]  HPI 10/19/10- 49 yoFnever smoker followed for asthma, allergic rhinitis Last here July 01, 2010 - note reviewed.  Was sick with febrile illness in early May- we called in Z pak then Cefdinir. Still feeling a little diminished energy, some frontal and occipital headache, voice not strong. Scant pale yellow mucus. Minor wheeze is also improved. Didn't use her rescue inhaler.  Probable snoring and some reflux.   04/20/11- 49 yoF never smoker followed for asthma, allergic rhinitis Has not had flu vaccine yet but was recently exposed to a child with flu at Thanksgiving. Had some wheezing during the summer including to colds. Advair does help but she has not felt the need to use her rescue inhaler. Singulair is also considered a big help. She continues Nasonex and Zyrtec.  09/28/11- 49 yoF never smoker followed for asthma, allergic rhinitis Started Prednisone taper given 5/13-can tell some difference. Feels like "rocks" in chest-deep cough. Acute onset for 5 days ago of sore throat, headache but no fever. We had called in prednisone taper yesterday and she thinks it may be starting to help but she is still wheezing. She is going to a soccer tournament will be outdoors. Has wheezed some all spring.  04/19/12- 54 yoF never smoker followed for asthma, allergic rhinitis FOLLOWS FOR: increased wheezing; worse with activity. Has been in an old house and thinks this may have caused it.; nasal congestion and has had to use Proair inhaler more than usual Spent Thanksgiving her old family [place]house had been closed up. She routinely gets head and chest congestion there  but it is a family tradition. Not a cold. Singulair and Mucinex D. are not sufficient for nasal congestion now. Some wheeze. No headache her discharge. Using Advair, with rescue inhaler once daily. Asks about retesting her allergy status.  01/07/13- 54 yoF  never smoker followed for asthma, allergic rhinitis ACUTE VISIT: recent headcold 3-4 weeks ago; Thursday and Friday started hurting in Right side under breast and has now moved to her left side under breast; cough is productive-dark green in color; has been using plain Mucinex. Denies any fever or chills. GI okay. Mild DOE.  04/04/13-52 yoF never smoker followed for asthma, allergic rhinitis Follows for-persistent wheezing.  Pt's obgyn gave her augmentin, daily neb tx is not helping. This began with a cold in August. Wheeze is better but not gone. Biaxin did help. Her GYN then gave Augmentin. Much postnasal drip. Cough mostly clear sputum. Denies fever, sweat, adenopathy, chest pain. PFT: 01/28/2010 within normal limits with insignificant response to bronchodilator. Mild decrease in diffusion capacity. FVC 2.70/81%, FEV1 2.22/87%, FEV1/FEC 0.82. TLC 90%, DLCO 75%.  1/61/5- 52 yoF never smoker followed for asthma, allergic rhinitis FOLLOWS FOR:  Breathing doing well.  Has had a cold with cough and yellow mucus x2 weeks.  Discuss Breo trial- did better, then went back to Advair. Bad cold at Christmas time with much wheeze but says she was "afraid of" the emergency room. Went to an TEFL teacher and got 3 bottles of mixed herbal products. She liked Xopenex nebulizer treatment here but dislikes using albuterol at home because it makes her jittery. CXR 04/04/13 IMPRESSION:  No active cardiopulmonary disease.  Electronically Signed  By: Kathreen Devoid  On: 04/04/2013 14:16  01/14/14- 54 yoF never smoker followed for asthma, allergic rhinitis FOLLOWS FOR: allergies are flaring up at this time; slight wheezing. Was given  Prednisone rx for flare up(did not finish)last week of July.Wonders if she needs another rd or injection.  03/21/14- 54 yoF never smoker followed for asthma, allergic rhinitis ACUTE VISIT: increased wheezing; fell 4 weeks ago and never got checked out-unsure if she cracked or  bruised her rib(s). She states she has had a hard time moving around and breathing. On Breo, has nebulizer. Wheeze since 2 weeks after last here. Fell 2 weeks ago, hitting L ribs against bathtub. Pain slowly better. R frontal HA, nasal congestion lately. She asks about need for Chest Ct because of persistent wheeze.  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  07/15/14- 53 yoF never smoker followed for asthma, allergic rhinitis FOLLOWS FOR: Pt still on 10 mg Prednisone. She has some wheezing, non-productive cough and sob with exertion. Pt denies chest tightness Wheeze had cleared with prednisone, but then she caught a new cold. Since December has been on prednisone 10 mg daily. Using nebulizer occasionally.  10/15/14-  54 yoF never smoker followed for asthma, allergic rhinitis Pt. finished prednisone,  Still SOB AND WHEEZING Persistent wheeze, yellow sputum, sinus congestion if she skips twice daily Flonase. Using rescue inhaler once a day, occasional nebulizer. She reminds that wheeze began when she moved into this house 10 or 12 years ago. There is a basement, no pets, no smokers, gas heat, hardwood floors. CXR 03/21/14 IMPRESSION: There is no acute thoracic trauma nor definite evidence of acute cardiopulmonary abnormality. Coarse interstitial lung markings are stable and likely reflect the patient's known asthma. Electronically Signed  By: David Martinique  On: 03/21/2014 12:30  Review of Systems-see HPI Constitutional:   No-   weight loss, night sweats, fevers, chills, fatigue, lassitude. HEENT:   No- headaches, No difficulty swallowing, tooth/dental problems,  sore throat,       No-  sneezing, itching, ear ache, +nasal congestion, post nasal drip,  CV:  +chest pain, orthopnea, PND, swelling in lower extremities, anasarca, dizziness, palpitations Resp: No-   shortness of breath with exertion or at rest.             No- productive cough,  No non-productive cough,  No- coughing up of  blood.              No-change in color of mucus.  + wheezing.   Skin: No-   rash or lesions. GI:  No-   heartburn, indigestion, abdominal pain, nausea, vomiting, GU:  MS:  No-   joint pain or swelling.  in. Neuro-     nothing unusual Psych:  No- change in mood or affect. No depression or anxiety.  No memory loss.  Objective:   Physical Exam General- Alert, Oriented, Affect-appropriate, Distress- none acute, overweight Skin- rash-none, lesions- none, excoriation- none Lymphadenopathy- none Head- atraumatic            Eyes- Gross vision intact, PERRLA, conjunctivae clear secretions            Ears- Hearing, canals-normal            Nose- + sniffing, + turbinate edema, no-Septal dev,  polyps, erosion, perforation             Throat- Mallampati III , mucosa clear , drainage- none, tonsils- atrophic Neck- flexible , trachea midline, no stridor , thyroid nl, carotid no bruit Chest - symmetrical excursion , unlabored           Heart/CV- RRR , no murmur , no gallop  , no rub, nl s1 s2                           -  JVD- none , edema- none, stasis changes- none, varices- none           Lung- + mild wheeze bilateral, unlabored, cough-none , dullness-none, rub- none           Chest wall-  Abd-  Br/ Gen/ Rectal- Not done, not indicated Extrem- cyanosis- none, clubbing, none, atrophy- none, strength- nl Neuro- grossly intact to observation

## 2014-10-15 NOTE — Patient Instructions (Addendum)
Sample Anoro inhaler  1 puff, once daily.   Try this instead of Breo for comparison. Then go back to Breo 200.  Order- schedule PFT   Dx asthma moderate uncomplicated  Order- Schedule limited CT sinus max face  Dx chronic sinusitis  Order- lab- Allergy profile, CBC with diff     Dx asthma moderate uncomplicated  Script- albuterol rescue inhaler to replace Proair with one covered  By your insurance

## 2014-10-18 NOTE — Assessment & Plan Note (Signed)
A low-grade persistent sinus infection could explain upper and lower respiratory symptoms. Plan-limited CT of sinuses to exclude sinusitis because symptoms are ambiguous

## 2014-10-18 NOTE — Assessment & Plan Note (Signed)
Wheezing on exam. Can't exclude a component of vocal cord dysfunction but I think most of this is bronchospasm. Plan-schedule PFT, lab for CBC with differential and allergy profile, try sample of Anoro instead of Breo

## 2014-10-18 NOTE — Assessment & Plan Note (Signed)
Notice sniffing and some turbinate edema. Symptoms are controlled with twice daily Flonase Plan-suggest antihistamines

## 2014-10-20 ENCOUNTER — Other Ambulatory Visit (INDEPENDENT_AMBULATORY_CARE_PROVIDER_SITE_OTHER): Payer: 59

## 2014-10-20 ENCOUNTER — Ambulatory Visit (INDEPENDENT_AMBULATORY_CARE_PROVIDER_SITE_OTHER)
Admission: RE | Admit: 2014-10-20 | Discharge: 2014-10-20 | Disposition: A | Discharge status: Home / Self Care | Payer: 59 | Source: Ambulatory Visit | Attending: Internal Medicine | Admitting: Internal Medicine

## 2014-10-20 DIAGNOSIS — J329 Chronic sinusitis, unspecified: Secondary | ICD-10-CM

## 2014-10-20 DIAGNOSIS — J4540 Moderate persistent asthma, uncomplicated: Secondary | ICD-10-CM | POA: Diagnosis not present

## 2014-10-20 LAB — CBC WITH DIFFERENTIAL/PLATELET
BASOS PCT: 0.6 % (ref 0.0–3.0)
Basophils Absolute: 0 10*3/uL (ref 0.0–0.1)
EOS ABS: 0.8 10*3/uL — AB (ref 0.0–0.7)
Eosinophils Relative: 11.7 % — ABNORMAL HIGH (ref 0.0–5.0)
HCT: 44.5 % (ref 36.0–46.0)
Hemoglobin: 14.9 g/dL (ref 12.0–15.0)
LYMPHS ABS: 2 10*3/uL (ref 0.7–4.0)
LYMPHS PCT: 29.7 % (ref 12.0–46.0)
MCHC: 33.4 g/dL (ref 30.0–36.0)
MCV: 91 fl (ref 78.0–100.0)
MONO ABS: 0.5 10*3/uL (ref 0.1–1.0)
MONOS PCT: 6.8 % (ref 3.0–12.0)
NEUTROS PCT: 51.2 % (ref 43.0–77.0)
Neutro Abs: 3.4 10*3/uL (ref 1.4–7.7)
Platelets: 278 10*3/uL (ref 150.0–400.0)
RBC: 4.89 Mil/uL (ref 3.87–5.11)
RDW: 13.5 % (ref 11.5–15.5)
WBC: 6.7 10*3/uL (ref 4.0–10.5)

## 2014-10-21 ENCOUNTER — Telehealth: Payer: Self-pay | Admitting: Internal Medicine

## 2014-10-21 LAB — ALLERGY FULL PROFILE
Allergen, D pternoyssinus,d7: <0.1 kU/L
Allergen,Goose feathers, e70: <0.1 kU/L
Alternaria Alternata: <0.1 kU/L
Bahia Grass: <0.1 kU/L
Bermuda Grass: <0.1 kU/L
Candida Albicans: <0.1 kU/L
Common Ragweed: <0.1 kU/L
Curvularia lunata: <0.1 kU/L
Dog Dander: <0.1 kU/L
Elm IgE: <0.1 kU/L
FESCUE: 0.71 kU/L — AB
G005 Rye, Perennial: 0.64 kU/L — ABNORMAL HIGH
G009 Red Top: 0.97 kU/L — ABNORMAL HIGH
Goldenrod: <0.1 kU/L
House Dust Hollister: <0.1 kU/L
IgE (Immunoglobulin E), Serum: 263 kU/L — ABNORMAL HIGH (ref <115)
Lamb's Quarters: <0.1 kU/L
Oak: <0.1 kU/L
Stemphylium Botryosum: <0.1 kU/L
Sycamore Tree: <0.1 kU/L
Timothy Grass: 0.23 kU/L — ABNORMAL HIGH

## 2014-10-21 MED ORDER — AMOXICILLIN-POT CLAVULANATE 875-125 MG PO TABS: 1.0000 | ORAL_TABLET | Freq: Two times a day (BID) | ORAL | Status: DC

## 2014-10-21 NOTE — Progress Notes (Signed)
Quick Note:  Called and spoke to pt. Informed pt of the results and recs per Cy. Rx sent to preferred pharmacy. Pt verbalized understanding and denied any further questions or concerns at this time. ______

## 2014-10-21 NOTE — Telephone Encounter (Signed)
Called and spoke to pt. Informed pt of the results and recs per Cy. Rx sent to preferred pharmacy. Pt verbalized understanding and denied any further questions or concerns at this time.   CT of sinuses shows chronic disease, better behind right cheek bone, but worse in sinuses in the forehead and behind the nose. Suggest Rx augmentin 875 mg, # 20, 1 twice daily. Want to try to clear this out. See if this stops the cough.

## 2014-11-07 ENCOUNTER — Ambulatory Visit (INDEPENDENT_AMBULATORY_CARE_PROVIDER_SITE_OTHER): Payer: 59 | Admitting: Internal Medicine

## 2014-11-07 ENCOUNTER — Encounter: Payer: Self-pay | Admitting: Internal Medicine

## 2014-11-07 VITALS — BP 112/64 | HR 92 | Ht 63.0 in | Wt 203.2 lb

## 2014-11-07 DIAGNOSIS — J3089 Other allergic rhinitis: Secondary | ICD-10-CM

## 2014-11-07 DIAGNOSIS — J329 Chronic sinusitis, unspecified: Secondary | ICD-10-CM | POA: Insufficient documentation

## 2014-11-07 DIAGNOSIS — J309 Allergic rhinitis, unspecified: Secondary | ICD-10-CM

## 2014-11-07 DIAGNOSIS — J4540 Moderate persistent asthma, uncomplicated: Secondary | ICD-10-CM | POA: Diagnosis not present

## 2014-11-07 DIAGNOSIS — J302 Other seasonal allergic rhinitis: Secondary | ICD-10-CM

## 2014-11-07 DIAGNOSIS — J324 Chronic pansinusitis: Secondary | ICD-10-CM

## 2014-11-07 DIAGNOSIS — J40 Bronchitis, not specified as acute or chronic: Secondary | ICD-10-CM

## 2014-11-07 NOTE — Patient Instructions (Signed)
We plan for you to see your ENT in [place] about your chronic sinus disease.  If your sinuses can be cleared sufficiently, your asthma may calm down  Ok to continue current asthma therapy  We will start the application process for Xolair

## 2014-11-07 NOTE — Assessment & Plan Note (Signed)
Allergy profile in vitro assessment of IgE levels would suggest primary seasonal allergic issues would come in last half of spring pollen season. That suggests persistent symptoms probably relate to chronic sinus disease demonstrated on recent CT.

## 2014-11-07 NOTE — Assessment & Plan Note (Signed)
Potential for normal PFT flows indicate significant reversibility when considering the times that she presents here with significant wheeze. Elevated eosinophil count could be a target for IL5 agent Nucala Elevated IgE level could be target for Xolair Both of these were discussed again with her If chronic sinus disease is maintaining inflammation in the lower airways, then potentially, good control of her sinus disease might result in clearing of asthma without need for a long-term immune modulation therapy. Plan-we will start the application process for Xolair while she goes forward with ENT evaluation.

## 2014-11-07 NOTE — Progress Notes (Signed)
Patient ID: Amy Rollins, female    DOB: [birth year], 54 y.o.   MRN: [id number]  HPI 10/19/10- 49 yoFnever smoker followed for asthma, allergic rhinitis Last here July 01, 2010 - note reviewed.  Was sick with febrile illness in early May- we called in Z pak then Cefdinir. Still feeling a little diminished energy, some frontal and occipital headache, voice not strong. Scant pale yellow mucus. Minor wheeze is also improved. Didn't use her rescue inhaler.  Probable snoring and some reflux.   04/20/11- 49 yoF never smoker followed for asthma, allergic rhinitis Has not had flu vaccine yet but was recently exposed to a child with flu at Thanksgiving. Had some wheezing during the summer including to colds. Advair does help but she has not felt the need to use her rescue inhaler. Singulair is also considered a big help. She continues Nasonex and Zyrtec.  09/28/11- 49 yoF never smoker followed for asthma, allergic rhinitis Started Prednisone taper given 5/13-can tell some difference. Feels like "rocks" in chest-deep cough. Acute onset for 5 days ago of sore throat, headache but no fever. We had called in prednisone taper yesterday and she thinks it may be starting to help but she is still wheezing. She is going to a soccer tournament will be outdoors. Has wheezed some all spring.  04/19/12- 60 yoF never smoker followed for asthma, allergic rhinitis FOLLOWS FOR: increased wheezing; worse with activity. Has been in an old house and thinks this may have caused it.; nasal congestion and has had to use Proair inhaler more than usual Spent Thanksgiving her old family [place]house had been closed up. She routinely gets head and chest congestion there  but it is a family tradition. Not a cold. Singulair and Mucinex D. are not sufficient for nasal congestion now. Some wheeze. No headache her discharge. Using Advair, with rescue inhaler once daily. Asks about retesting her allergy status.  01/07/13- 49 yoF  never smoker followed for asthma, allergic rhinitis ACUTE VISIT: recent headcold 3-4 weeks ago; Thursday and Friday started hurting in Right side under breast and has now moved to her left side under breast; cough is productive-dark green in color; has been using plain Mucinex. Denies any fever or chills. GI okay. Mild DOE.  04/04/13-52 yoF never smoker followed for asthma, allergic rhinitis Follows for-persistent wheezing.  Pt's obgyn gave her augmentin, daily neb tx is not helping. This began with a cold in August. Wheeze is better but not gone. Biaxin did help. Her GYN then gave Augmentin. Much postnasal drip. Cough mostly clear sputum. Denies fever, sweat, adenopathy, chest pain. PFT: 01/28/2010 within normal limits with insignificant response to bronchodilator. Mild decrease in diffusion capacity. FVC 2.70/81%, FEV1 2.22/87%, FEV1/FEC 0.82. TLC 90%, DLCO 75%.  1/61/5- 52 yoF never smoker followed for asthma, allergic rhinitis FOLLOWS FOR:  Breathing doing well.  Has had a cold with cough and yellow mucus x2 weeks.  Discuss Breo trial- did better, then went back to Advair. Bad cold at Christmas time with much wheeze but says she was "afraid of" the emergency room. Went to an TEFL teacher and got 3 bottles of mixed herbal products. She liked Xopenex nebulizer treatment here but dislikes using albuterol at home because it makes her jittery. CXR 04/04/13 IMPRESSION:  No active cardiopulmonary disease.  Electronically Signed  By: Kathreen Devoid  On: 04/04/2013 14:16  01/14/14- 53 yoF never smoker followed for asthma, allergic rhinitis FOLLOWS FOR: allergies are flaring up at this time; slight wheezing. Was given  Prednisone rx for flare up(did not finish)last week of July.Wonders if she needs another rd or injection.  03/21/14- 89 yoF never smoker followed for asthma, allergic rhinitis ACUTE VISIT: increased wheezing; fell 4 weeks ago and never got checked out-unsure if she cracked or  bruised her rib(s). She states she has had a hard time moving around and breathing. On Breo, has nebulizer. Wheeze since 2 weeks after last here. Fell 2 weeks ago, hitting L ribs against bathtub. Pain slowly better. R frontal HA, nasal congestion lately. She asks about need for Chest Ct because of persistent wheeze.  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  07/15/14- 53 yoF never smoker followed for asthma, allergic rhinitis FOLLOWS FOR: Pt still on 10 mg Prednisone. She has some wheezing, non-productive cough and sob with exertion. Pt denies chest tightness Wheeze had cleared with prednisone, but then she caught a new cold. Since December has been on prednisone 10 mg daily. Using nebulizer occasionally.  10/15/14-  51 yoF never smoker followed for asthma, allergic rhinitis Pt. finished prednisone,  Still SOB AND WHEEZING Persistent wheeze, yellow sputum, sinus congestion if she skips twice daily Flonase. Using rescue inhaler once a day, occasional nebulizer. She reminds that wheeze began when she moved into this house 10 or 12 years ago. There is a basement, no pets, no smokers, gas heat, hardwood floors. CXR 03/21/14 IMPRESSION: There is no acute thoracic trauma nor definite evidence of acute cardiopulmonary abnormality. Coarse interstitial lung markings are stable and likely reflect the patient's known asthma. Electronically Signed  By: David Martinique  On: 03/21/2014 12:30  11/07/14-  74 yoF never smoker followed for asthma, allergic rhinitis follows for: Pt here to discuss [place] options. Pt reports she is still wheezing, ear aches at times, HA.  CT max fac 10/20/14- IMPRESSION: Diffuse sinus disease changes as above. When compared to the previous exam, ethmoid and LEFT maxillary changes appear stable, previously seen RIGHT maxillary sinus air-fluid level resolved, and frontal and sphenoid disease changes have increased. Electronically Signed  By: Lavonia Dana M.D.  On: 10/20/2014  09:37 Allergy profile 10/20/14- Total IgE 263, elevated grass pollens CBC- 10/20/14  EOS absolute 0.8 (H) She found Anoro inhaler sample no better than BREO Ellipta so she continues BREO. After Augmentin she has less drainage. Still wheezes. 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  Review of Systems-see HPI Constitutional:   No-   weight loss, night sweats, fevers, chills, fatigue, lassitude. HEENT:   + headaches, No difficulty swallowing, tooth/dental problems,  sore throat,       No-  sneezing, itching, ear ache, +nasal congestion, post nasal drip,  CV:        chest pain, orthopnea, PND, swelling in lower extremities, anasarca, dizziness, palpitations Resp: No-   shortness of breath with exertion or at rest.             No- productive cough,  No non-productive cough,  No- coughing up of blood.              No-change in color of mucus.  + wheezing.   Skin: No-   rash or lesions. GI:  No-   heartburn, indigestion, abdominal pain, nausea, vomiting, GU:  MS:  No-   joint pain or swelling.  Neuro-     nothing unusual Psych:  No- change in mood or affect. No depression or anxiety.  No memory loss.  Objective:   Physical Exam General- Alert, Oriented, Affect-appropriate, Distress-  none acute,                          overweight Skin- rash-none, lesions- none, excoriation- none Lymphadenopathy- none Head- atraumatic            Eyes- Gross vision intact, PERRLA, conjunctivae clear secretions            Ears- Hearing, canals-normal            Nose-  sniffing, + turbinate edema, no-Septal dev,  polyps, erosion, perforation             Throat- Mallampati III , mucosa clear , drainage- none, tonsils- atrophic Neck- flexible , trachea midline, no stridor , thyroid nl, carotid no bruit Chest - symmetrical excursion , unlabored           Heart/CV- RRR , no murmur , no gallop  , no rub, nl s1 s2                           - JVD- none , edema- none, stasis  changes- none, varices- none           Lung- + mild wheeze bilateral, unlabored, cough-none , dullness-none, rub- none           Chest wall-  Abd-  Br/ Gen/ Rectal- Not done, not indicated Extrem- cyanosis- none, clubbing, none, atrophy- none, strength- nl Neuro- grossly intact to observation

## 2014-11-10 ENCOUNTER — Telehealth: Payer: Self-pay | Admitting: Internal Medicine

## 2014-11-10 ENCOUNTER — Ambulatory Visit: Payer: 59 | Admitting: Internal Medicine

## 2014-11-12 ENCOUNTER — Telehealth: Payer: Self-pay | Admitting: Internal Medicine

## 2014-11-12 NOTE — Telephone Encounter (Signed)
Patient called, lots of beeping noises, then call got disconnected.  She states this happened when she called last time.  Sounds like she is hitting the number keys on her phone and possibly ending the call by mistake also.

## 2014-11-12 NOTE — Telephone Encounter (Signed)
Spoke with pt. She is requesting her OV note and CT report be faxed to Dr. Vevelyn Royals E[facility] at fax above. i have done so. Nothing further needed

## 2014-11-20 NOTE — Telephone Encounter (Signed)
I was able to send paperwork through for Xolair without patients spouse's DOB. Will close message.

## 2014-11-27 ENCOUNTER — Telehealth: Payer: Self-pay | Admitting: Internal Medicine

## 2014-11-27 NOTE — Telephone Encounter (Signed)
I have the forms and will fax back today. Thanks.

## 2014-11-27 NOTE — Telephone Encounter (Signed)
Called spoke with Ronalee Belts from [facility]. He is wanting to know if pt signed PAN form as they did not receive this. Please advise Joellen Jersey thanks

## 2014-11-27 NOTE — Telephone Encounter (Signed)
These forms have been faxed back and the call was more of an FYI as to where the case stood.

## 2014-12-11 ENCOUNTER — Telehealth: Payer: Self-pay | Admitting: Internal Medicine

## 2014-12-11 NOTE — Telephone Encounter (Signed)
I have already spoken to [facility] and made Aaron Edelman aware that we will not go through with Xolair start program while waiting on PA approval/denial. They are aware that we do not want to start pt on Xolair and then get denial from her insurance company and have to stop. Please make sure Junie Panning is aware of this as I am with MD all day today. Thanks.

## 2014-12-11 NOTE — Telephone Encounter (Signed)
Erin at [facility] has been notified. Nothing further needed.

## 2014-12-16 ENCOUNTER — Other Ambulatory Visit: Payer: Self-pay | Admitting: Emergency Medicine

## 2014-12-16 MED ORDER — FLUTICASONE FUROATE-VILANTEROL 200-25 MCG/INH IN AEPB: 1.0000 | INHALATION_SPRAY | Freq: Every day | RESPIRATORY_TRACT | Status: DC

## 2015-01-05 ENCOUNTER — Telehealth: Payer: Self-pay | Admitting: Internal Medicine

## 2015-01-07 NOTE — Telephone Encounter (Addendum)
To Katie .Marland Kitchen I tried to call January back, but number left is a fax number instead of a phone number

## 2015-01-12 NOTE — Telephone Encounter (Signed)
Katie - do you have another number for [facility] dept regarding Xolair, the only number that I have is a fax number.  Cannot call back.  Please advise.

## 2015-01-14 NOTE — Telephone Encounter (Signed)
Katie, please advise.  

## 2015-01-15 NOTE — Telephone Encounter (Signed)
I have all the needed information for [employer]. Will process again on Friday 01-16-15 with urgent request. Thanks.

## 2015-03-17 ENCOUNTER — Encounter: Payer: Self-pay | Admitting: *Deleted

## 2015-03-19 ENCOUNTER — Other Ambulatory Visit: Payer: Self-pay

## 2015-05-01 ENCOUNTER — Ambulatory Visit (INDEPENDENT_AMBULATORY_CARE_PROVIDER_SITE_OTHER): Payer: 59 | Admitting: Certified Nurse Midwife

## 2015-05-01 ENCOUNTER — Encounter: Payer: Self-pay | Admitting: Certified Nurse Midwife

## 2015-05-01 VITALS — BP 116/70 | HR 70 | Resp 16 | Ht 63.25 in | Wt 204.0 lb

## 2015-05-01 DIAGNOSIS — Z01419 Encounter for gynecological examination (general) (routine) without abnormal findings: Secondary | ICD-10-CM

## 2015-05-01 DIAGNOSIS — Z0000 Encounter for general adult medical examination without abnormal findings: Secondary | ICD-10-CM | POA: Diagnosis not present

## 2015-05-01 DIAGNOSIS — N6320 Unspecified lump in the left breast, unspecified quadrant: Secondary | ICD-10-CM

## 2015-05-01 DIAGNOSIS — Z124 Encounter for screening for malignant neoplasm of cervix: Secondary | ICD-10-CM

## 2015-05-01 DIAGNOSIS — N63 Unspecified lump in breast: Secondary | ICD-10-CM | POA: Diagnosis not present

## 2015-05-01 DIAGNOSIS — N951 Menopausal and female climacteric states: Secondary | ICD-10-CM

## 2015-05-01 LAB — COMPREHENSIVE METABOLIC PANEL
ALBUMIN: 4 g/dL (ref 3.6–5.1)
ALT: 25 U/L (ref 6–29)
AST: 26 U/L (ref 10–35)
Alkaline Phosphatase: 88 U/L (ref 33–130)
BUN: 13 mg/dL (ref 7–25)
CO2: 23 mmol/L (ref 20–31)
CREATININE: 0.79 mg/dL (ref 0.50–1.05)
Calcium: 9 mg/dL (ref 8.6–10.4)
Chloride: 107 mmol/L (ref 98–110)
Glucose, Bld: 79 mg/dL (ref 65–99)
Potassium: 4 mmol/L (ref 3.5–5.3)
SODIUM: 141 mmol/L (ref 135–146)
TOTAL PROTEIN: 6.7 g/dL (ref 6.1–8.1)
Total Bilirubin: 0.4 mg/dL (ref 0.2–1.2)

## 2015-05-01 LAB — POCT URINALYSIS DIPSTICK
Bilirubin, UA: NEGATIVE
Blood, UA: NEGATIVE
Glucose, UA: NEGATIVE
Ketones, UA: NEGATIVE
Leukocytes, UA: NEGATIVE
Nitrite, UA: NEGATIVE
PROTEIN UA: NEGATIVE
UROBILINOGEN UA: NEGATIVE
pH, UA: 5

## 2015-05-01 LAB — LIPID PANEL
Cholesterol: 219 mg/dL — ABNORMAL HIGH (ref 125–200)
HDL: 80 mg/dL (ref >=46)
LDL Cholesterol: 125 mg/dL (ref <130)
Total CHOL/HDL Ratio: 2.7 Ratio (ref <=5.0)
Triglycerides: 72 mg/dL (ref <150)
VLDL: 14 mg/dL (ref <30)

## 2015-05-01 LAB — CBC
HEMATOCRIT: 43.5 % (ref 36.0–46.0)
Hemoglobin: 14.8 g/dL (ref 12.0–15.0)
MCH: 31.1 pg (ref 26.0–34.0)
MCHC: 34 g/dL (ref 30.0–36.0)
MCV: 91.4 fL (ref 78.0–100.0)
MPV: 10.4 fL (ref 8.6–12.4)
Platelets: 281 10*3/uL (ref 150–400)
RBC: 4.76 MIL/uL (ref 3.87–5.11)
RDW: 13.6 % (ref 11.5–15.5)
WBC: 7.4 10*3/uL (ref 4.0–10.5)

## 2015-05-01 LAB — TSH: TSH: 1.139 u[IU]/mL (ref 0.350–4.500)

## 2015-05-01 NOTE — Patient Instructions (Signed)

## 2015-05-01 NOTE — Progress Notes (Signed)
Patient is scheduled for Bilateral Breast Diagnostic Mammogram and L Breast Ultrasound at [facility] on 05/08/15 at 0800 . Patient agreeable to time/date/location.

## 2015-05-01 NOTE — Progress Notes (Signed)
54 y.o. G2P2 Married  Caucasian Fe here for annual exam. Menopausal no HRT. Denies vaginal bleeding.  Still having vaginal dryness did not purchase Estrace due cost. Recent death of mother in law who had lived with her( aspiration). Emotionally doing "Ok". Sees Fredric Mare PA for aex/labs/asthma management. Complaining of soreness in left breast. Denies injury, trauma, skin change or nipple discharge. Mammogram due. No other health issues today. Sons teenage now and busy with their activities!   Patient's last menstrual period was 11/14/2011.          Sexually active: Yes.    The current method of family planning is tubal ligation.    Exercising: No.  exercise Smoker:  no  Health Maintenance: Pap: 02-09-12 neg HPV HR neg MMG:  04-23-12 category 2 birads 1:neg Colonoscopy:  9/16 BMD:   5/10 TDaP:  2013 Shingles: n/a Pneumonia: allergy Hep C and HIV: unsure Labs: poct urine-neg Self breast exam: pt checks them   reports that she has never smoked. She has never used smokeless tobacco. She reports that she drinks about 2.4 - 4.2 oz of alcohol per week. She reports that she does not use illicit drugs.  Past Medical History  Diagnosis Date  . Allergic rhinitis, cause unspecified   . Asthma   . Rhinosinusitis   . Anemia   . Anxiety and depression     with previous medication use    Past Surgical History  Procedure Laterality Date  . Tonsillectomy    . Cesarean section      x2  . Cholecystectomy    . Tubal ligation    . Left radius fracture    . Bunionectomy      left  . Hemorrhoid banding      Current Outpatient Prescriptions  Medication Sig Dispense Refill  . albuterol (PROAIR HFA) 108 (90 BASE) MCG/ACT inhaler Inhale 2 puffs into the lungs every 4 (four) hours as needed for wheezing or shortness of breath. 3 Inhaler 1  . Cyanocobalamin (VITAMIN B-12) 2500 MCG SUBL Place 1 tablet under the tongue daily.    . fluticasone (FLONASE) 50 MCG/ACT nasal spray Place 2 sprays into  the nose 2 (two) times daily. 48 g 1  . Fluticasone Furoate-Vilanterol (BREO ELLIPTA) 200-25 MCG/INH AEPB Inhale 1 puff into the lungs daily. 60 each 11  . ipratropium (ATROVENT) 0.02 % nebulizer solution Take 2.5 mLs (0.5 mg total) by nebulization every 6 (six) hours as needed for wheezing or shortness of breath. 62.5 mL 0  . levalbuterol (XOPENEX) 0.63 MG/3ML nebulizer solution 1 neb every 6 hours when needed 225 mL 3  . montelukast (SINGULAIR) 10 MG tablet TAKE 1 TABLET AT BEDTIME 90 tablet 1   No current facility-administered medications for this visit.    Family History  Problem Relation Age of Onset  . Emphysema Mother   . Cancer Mother     lung  . ALS Father   . Other Brother     born with one kidney  . Lupus Maternal Grandmother   . Hypertension Mother   . Hypertension Brother     2  . Heart disease Mother   . Osteopenia Father     ROS:  Pertinent items are noted in HPI.  Otherwise, a comprehensive ROS was negative.  Exam:   BP 116/70 mmHg  Pulse 70  Resp 16  Ht 5' 3.25" (1.607 m)  Wt 204 lb (92.534 kg)  BMI 35.83 kg/m2  LMP 11/14/2011 Height: 5' 3.25" (160.7 cm)  Ht Readings from Last 3 Encounters:  05/01/15 5' 3.25" (1.607 m)  11/07/14 5\' 3"  (1.6 m)  10/15/14 5\' 3"  (1.6 m)    General appearance: alert, cooperative and appears stated age Head: Normocephalic, without obvious abnormality, atraumatic Neck: no adenopathy, supple, symmetrical, trachea midline and thyroid normal to inspection and palpation Lungs: clear to auscultation bilaterally Breasts: normal appearance, no masses or tenderness, No nipple retraction or dimpling, No nipple discharge or bleeding, No axillary or supraclavicular adenopathy, left breast at 7 o'clock small 1 cm moveable mass noted at outer edge of breast,non tender. Heart: regular rate and rhythm Abdomen: soft, non-tender; no masses,  no organomegaly Extremities: extremities normal, atraumatic, no cyanosis or edema Skin: Skin color,  texture, turgor normal. No rashes or lesions Lymph nodes: Cervical, supraclavicular, and axillary nodes normal. No abnormal inguinal nodes palpated Neurologic: Grossly normal   Pelvic: External genitalia:  no lesions              Urethra:  normal appearing urethra with no masses, tenderness or lesions              Bartholin's and Skene's: normal                 Vagina: normal appearing vagina with normal color and discharge, no lesions              Cervix: normal,non tender,, no lesions              Pap taken: Yes.   Bimanual Exam:  Uterus:  normal size, contour, position, consistency, mobility, non-tender              Adnexa: normal adnexa and no mass, fullness, tenderness               Rectovaginal: Confirms               Anus:  normal sphincter tone, no lesions  Chaperone present: yes  A:  Well Woman with normal exam  Menopausal no HRT  Vaginal dryness  Left breast mass ? Cyst  Screening labs requested  Asthma/allergies with PCP management.   P:   Reviewed health and wellness pertinent to exam  Aware of need to evaluate if vaginal bleeding  Discussed coconut oil use with instructions for applicator use nightly and apply to vulva and vaginal area also. Can also be used for sexual activity. Questions addressed.  Discussed need to evaluate breast mass with diagnostic mammogram and [place]. Patient agreeable. Will schedule prior to leaving today. Screening due on other breast.  Lab:CBC,CMP,Hep C, HIV,Lipid panel,,TSH,Vitamin d  Continue follow up as indicated with MD  Pap smear as above with HPV reflex   counseled on breast self exam, mammography screening, menopause, adequate intake of calcium and vitamin D, diet and exercise, Kegel's exercises  return annually or prn  An After Visit Summary was printed and given to the patient.

## 2015-05-02 LAB — VITAMIN D 25 HYDROXY (VIT D DEFICIENCY, FRACTURES): Vit D, 25-Hydroxy: 26 ng/mL — ABNORMAL LOW (ref 30–100)

## 2015-05-02 LAB — HEPATITIS C ANTIBODY: HCV Ab: NEGATIVE

## 2015-05-02 LAB — HIV ANTIBODY (ROUTINE TESTING W REFLEX): HIV: NONREACTIVE

## 2015-05-02 NOTE — Progress Notes (Signed)
Reviewed personally.  M. Suzanne Raegen Tarpley, MD.  

## 2015-05-05 ENCOUNTER — Ambulatory Visit: Payer: Self-pay | Admitting: Certified Nurse Midwife

## 2015-05-05 LAB — IPS PAP TEST WITH HPV

## 2015-05-08 ENCOUNTER — Ambulatory Visit
Admission: RE | Admit: 2015-05-08 | Discharge: 2015-05-08 | Disposition: A | Discharge status: Home / Self Care | Payer: 59 | Source: Ambulatory Visit | Attending: Certified Nurse Midwife | Admitting: Certified Nurse Midwife

## 2015-05-08 ENCOUNTER — Telehealth: Payer: Self-pay | Admitting: Internal Medicine

## 2015-05-08 ENCOUNTER — Other Ambulatory Visit: Payer: Self-pay | Admitting: Internal Medicine

## 2015-05-08 DIAGNOSIS — N6320 Unspecified lump in the left breast, unspecified quadrant: Secondary | ICD-10-CM

## 2015-05-08 MED ORDER — AMOXICILLIN-POT CLAVULANATE 875-125 MG PO TABS: 1.0000 | ORAL_TABLET | Freq: Two times a day (BID) | ORAL | Status: DC

## 2015-05-08 NOTE — Telephone Encounter (Signed)
Offer augmentin 875 mg, # 14, 1 twice daily Decongestants, mucus thinners and extra hydration may help

## 2015-05-08 NOTE — Telephone Encounter (Signed)
Spoke with pt. She is aware of CY's recommendation. Rx has been sent in. Nothing further was needed.  

## 2015-05-08 NOTE — Telephone Encounter (Signed)
Patient thinks she has sinus infection, sinus pressure, headaches.  History of getting sinus infections.  Takes Singulair every night for maintenance. Some yellow mucus.  [facility]  Current Outpatient Prescriptions on File Prior to Visit  Medication Sig Dispense Refill  . albuterol (PROAIR HFA) 108 (90 BASE) MCG/ACT inhaler Inhale 2 puffs into the lungs every 4 (four) hours as needed for wheezing or shortness of breath. 3 Inhaler 1  . Cyanocobalamin (VITAMIN B-12) 2500 MCG SUBL Place 1 tablet under the tongue daily.    . fluticasone (FLONASE) 50 MCG/ACT nasal spray Place 2 sprays into the nose 2 (two) times daily. 48 g 1  . Fluticasone Furoate-Vilanterol (BREO ELLIPTA) 200-25 MCG/INH AEPB Inhale 1 puff into the lungs daily. 60 each 11  . ipratropium (ATROVENT) 0.02 % nebulizer solution Take 2.5 mLs (0.5 mg total) by nebulization every 6 (six) hours as needed for wheezing or shortness of breath. 62.5 mL 0  . levalbuterol (XOPENEX) 0.63 MG/3ML nebulizer solution 1 neb every 6 hours when needed 225 mL 3  . montelukast (SINGULAIR) 10 MG tablet TAKE 1 TABLET AT BEDTIME 90 tablet 1   No current facility-administered medications on file prior to visit.      Allergies  Allergen Reactions  . Levofloxacin     REACTION: severe-hives,edema,throat closed up  . Pneumococcal Vaccines     Per MD-never take again; red area under and around arm

## 2015-05-14 ENCOUNTER — Telehealth: Payer: Self-pay | Admitting: Emergency Medicine

## 2015-05-14 NOTE — Telephone Encounter (Signed)
Patient returned call and she is scheduled for breast check with [name] CNM 05/27/15. Routing to provider for final review. Patient agreeable to disposition. Will close encounter.

## 2015-05-14 NOTE — Telephone Encounter (Signed)
-----   Message from Regina Eck, CNM sent at 05/13/2015 12:59 PM EST ----- Due to tenderness and area felt would like to recheck area to see if still present, even though mammogram was negative and  US showed fibroglandular tissues ridge. Please schedule. Patient was aware I wanted to recheck.

## 2015-05-14 NOTE — Telephone Encounter (Signed)
Message left to return call to Cira Deyoe at [phone].    

## 2015-05-27 ENCOUNTER — Ambulatory Visit (INDEPENDENT_AMBULATORY_CARE_PROVIDER_SITE_OTHER): Payer: 59 | Admitting: Certified Nurse Midwife

## 2015-05-27 ENCOUNTER — Encounter: Payer: Self-pay | Admitting: Certified Nurse Midwife

## 2015-05-27 VITALS — BP 110/70 | HR 70 | Resp 16 | Ht 63.25 in | Wt 204.0 lb

## 2015-05-27 DIAGNOSIS — R899 Unspecified abnormal finding in specimens from other organs, systems and tissues: Secondary | ICD-10-CM | POA: Diagnosis not present

## 2015-05-27 DIAGNOSIS — N6459 Other signs and symptoms in breast: Secondary | ICD-10-CM

## 2015-05-27 DIAGNOSIS — Z0000 Encounter for general adult medical examination without abnormal findings: Secondary | ICD-10-CM

## 2015-05-27 LAB — VITAMIN B12: Vitamin B-12: 670 pg/mL (ref 211–911)

## 2015-05-27 NOTE — Progress Notes (Signed)
   Subjective:   55 y.o. Married Caucasian female presents for follow up of ? l eft breast mass, which patient had noted some tenderness and related to provider on aex 05/01/15. Exam noted small area of concern/tenderness. Diagnostic mammogram and [place] were ordered. Findings were no evidence of malignancy in either breast, but a ridge of normal thick fibroglandular tissue was noted in area of concern and tenderness in left breast. Patient feels tenderness has not totally resolved and has noted tenderness in same area of right breast. Patient wears an underwire bra and a tight undergarment for support. Denies any other injuries or sexual activity manipulation, but does admit to feeling breast for tenderness. No family history of breast cancer. No hormone use. Patient would like Vit. B 12 screening today due to sister having issues with it and has helped with weight loss. Was out of town and did not receive information on aex labs. No other health issues today.  Review of Systems Pertinent items noted in HPI and remainder of comprehensive ROS otherwise negative.   Objective:   General appearance: alert, cooperative, appears stated age, no distress and mildly obese Breasts: No nipple retraction or dimpling, No nipple discharge or bleeding, No axillary or supraclavicular adenopathy, normal appearance, no skin change, no masses felt. Area of concern in left breast at 7 o'clock no longer palpable. Patient still has slight tenderness to touch in area under the breast. In inspection of both breast,underwire bra indentations and red line on skin noted, shown area to patient in mirror. Slight breast tenderness noted in same area of right breast. No masses noted.    Assessment:   ASSESSMENT:Patient is diagnosed with normal breast exam and normal diagnostic mammogram bilateral and [place] of left breast  Breast tenderness appears to be related to underwire bra compression. Screening labs from aex: all normal except  Vitamin D deficiency Request Vit. B12 screening   Plan:   Reviewed findings with patient of normal exam and imaging findings. Discussed no mass palpated today as from previous exam. Feel she may have bilateral tenderness due to bra use. Instructed to remove underwire from bra and wear without underwire to see if resolves. Also start on Advil 600 mg qid for 24 to 48 hours to see if resolves. Avoid palpating breast frequently. Patient to advise status after one week. Reviewed labs see epic from aex. Discussed Vitamin D and need to start on OTC 800 IU Vit. D 3 daily and recheck in 3 months, order in and patient to make lab appointment. Lab Vit. B12 today. Questions addressed.  Rv prn as above

## 2015-05-27 NOTE — Patient Instructions (Signed)

## 2015-05-28 NOTE — Progress Notes (Signed)
Reviewed personally.  M. Suzanne Piercen Covino, MD.  

## 2015-06-12 ENCOUNTER — Telehealth: Payer: Self-pay | Admitting: Emergency Medicine

## 2015-06-12 NOTE — Telephone Encounter (Signed)
-----   Message from Megan Salon, MD sent at 06/12/2015  6:28 AM EST ----- Regarding: RE: Mammogram hold  Yes, out of MMG hold.  MSM ----- Message -----    From: Michele Mcalpine, RN    Sent: 06/11/2015   4:59 PM      To: Megan Salon, MD Subject: Mammogram hold                                 Dr. Sabra Heck,  [name] CNM patient who was in hold for follow up breast check. Breast Imaging and follow up breast check completed. Okay to remove from hold?

## 2015-06-12 NOTE — Telephone Encounter (Signed)
Out of hold per Dr. Miller.   

## 2015-08-04 ENCOUNTER — Telehealth: Payer: Self-pay | Admitting: Internal Medicine

## 2015-08-04 MED ORDER — OSELTAMIVIR PHOSPHATE 75 MG PO CAPS: 75.0000 mg | ORAL_CAPSULE | Freq: Two times a day (BID) | ORAL | Status: DC

## 2015-08-04 MED ORDER — AZITHROMYCIN 250 MG PO TABS
ORAL_TABLET | ORAL | Status: AC
Start: 2015-08-04 — End: 2015-08-09

## 2015-08-04 NOTE — Telephone Encounter (Signed)
Spoke with pt's spouse, c/o prod cough with unknown color mucus, sinus congestion, fatigue, body weakness, fever of 101.4, X1 day. Pt has taken mucinex dm, tylenol, and sudafed to help with s/s.  [facility] [place]    Last ov: 11/07/14 Next ov: none  CY please advise on recs.  Thanks!  Allergies  Allergen Reactions  . Levofloxacin     REACTION: severe-hives,edema,throat closed up  . Pneumococcal Vaccines     Per MD-never take again; red area under and around arm   Current Outpatient Prescriptions on File Prior to Visit  Medication Sig Dispense Refill  . albuterol (PROAIR HFA) 108 (90 BASE) MCG/ACT inhaler Inhale 2 puffs into the lungs every 4 (four) hours as needed for wheezing or shortness of breath. 3 Inhaler 1  . Cyanocobalamin (VITAMIN B-12) 2500 MCG SUBL Place 1 tablet under the tongue daily.    . fluticasone (FLONASE) 50 MCG/ACT nasal spray PLACE 2 SPRAYS INTO THE NOSE TWICE A DAY 48 g 1  . Fluticasone Furoate-Vilanterol (BREO ELLIPTA) 200-25 MCG/INH AEPB Inhale 1 puff into the lungs daily. 60 each 11  . ipratropium (ATROVENT) 0.02 % nebulizer solution Take 2.5 mLs (0.5 mg total) by nebulization every 6 (six) hours as needed for wheezing or shortness of breath. 62.5 mL 0  . levalbuterol (XOPENEX) 0.63 MG/3ML nebulizer solution 1 neb every 6 hours when needed 225 mL 3  . montelukast (SINGULAIR) 10 MG tablet TAKE 1 TABLET BY MOUTH AT BEDTIME 90 tablet 1   No current facility-administered medications on file prior to visit.

## 2015-08-04 NOTE — Telephone Encounter (Signed)
Offer Tamiflu  75 mg, # 10, 1 twice daily            Zpak -2 today then one daily.

## 2015-08-04 NOTE — Telephone Encounter (Signed)
Spoke with pt's spouse, aware of below recs.  rx's sent to preferred pharmacy.  Nothing further needed.

## 2015-08-07 ENCOUNTER — Telehealth: Payer: Self-pay | Admitting: Internal Medicine

## 2015-08-07 MED ORDER — PREDNISONE 10 MG PO TABS: ORAL_TABLET | ORAL | Status: DC

## 2015-08-07 MED ORDER — FLUTICASONE FUROATE-VILANTEROL 200-25 MCG/INH IN AEPB: 1.0000 | INHALATION_SPRAY | Freq: Every day | RESPIRATORY_TRACT | Status: DC

## 2015-08-07 NOTE — Telephone Encounter (Signed)
Pt states she had flu Tuesday-was given Zpak and Tamiflu but can not get the wheezing to stop. Would like to have Prednisone (start at 60mg  ) taper Rx sent to [facility] and breo refill. Per CY okay to give and patient is aware. Nothing more needed at this time.

## 2015-08-31 ENCOUNTER — Telehealth: Payer: Self-pay | Admitting: Certified Nurse Midwife

## 2015-08-31 NOTE — Telephone Encounter (Signed)
Patient cancelled lab appointment for 3 month vit d recheck and will call back to reschedule after furniture market.

## 2015-09-01 ENCOUNTER — Other Ambulatory Visit: Payer: 59

## 2015-09-10 NOTE — Telephone Encounter (Signed)
Follow up with this in one week, market will be over

## 2015-09-16 NOTE — Telephone Encounter (Signed)
Left message for patient to call & schedule lab appt. 

## 2015-09-17 NOTE — Telephone Encounter (Signed)
Ok to close

## 2015-09-17 NOTE — Telephone Encounter (Signed)
Pt called & message left to reschedule appt. No callback. Okay to close encounter? Please advise

## 2015-12-05 ENCOUNTER — Other Ambulatory Visit: Payer: Self-pay | Admitting: Internal Medicine

## 2015-12-07 NOTE — Telephone Encounter (Signed)
Ok to refill now and x 3 Ask her to schedule f/u here with one of our doctors, or get future refills from her PCP.

## 2015-12-07 NOTE — Telephone Encounter (Signed)
Refill request received for the pt to have refills of the singulair.  Last ov was 11/2014 and pt was advised to follow up in 3 months.  No pending appts at this time.  CY please advise if ok to refill medication. thanks

## 2015-12-09 ENCOUNTER — Telehealth: Payer: Self-pay | Admitting: Internal Medicine

## 2015-12-09 NOTE — Telephone Encounter (Signed)
A denial refill request has been sent to the pharmacy. Pt needs an appointment.

## 2015-12-14 ENCOUNTER — Telehealth: Payer: Self-pay | Admitting: Internal Medicine

## 2015-12-14 NOTE — Telephone Encounter (Signed)
Ok to refill her prednisone taper  Since I am slowing down, we are no longer doing allergy skin testing here. We can do Allergy Profile Imunocap testing of blood for allergy. She might prefer to ask her PCP to refer her to one of the other allergy practices in town.

## 2015-12-14 NOTE — Telephone Encounter (Signed)
Pt called back, states she has 2 different things to bring up with CY's nurse-  Pt's asthma has been flaring up- increased SOB, wheezing, sometimes prod cough with clear mucus, stuffy nose X3 months intermittently.  Denies CP, fever, discolored mucus. Pt has been taking otc Allegra and Singulair daily to help with s/s.     Pt also wants to get an allergy test scheduled.    [facility]    CY please advise.  Thanks!   Allergies  Allergen Reactions  . Levofloxacin     REACTION: severe-hives,edema,throat closed up  . Pneumococcal Vaccines     Per MD-never take again; red area under and around arm   Current Outpatient Prescriptions on File Prior to Visit  Medication Sig Dispense Refill  . albuterol (PROAIR HFA) 108 (90 BASE) MCG/ACT inhaler Inhale 2 puffs into the lungs every 4 (four) hours as needed for wheezing or shortness of breath. 3 Inhaler 1  . Cyanocobalamin (VITAMIN B-12) 2500 MCG SUBL Place 1 tablet under the tongue daily.    . fluticasone (FLONASE) 50 MCG/ACT nasal spray PLACE 2 SPRAYS INTO THE NOSE TWICE A DAY 48 g 1  . fluticasone furoate-vilanterol (BREO ELLIPTA) 200-25 MCG/INH AEPB Inhale 1 puff into the lungs daily. 60 each 1  . ipratropium (ATROVENT) 0.02 % nebulizer solution Take 2.5 mLs (0.5 mg total) by nebulization every 6 (six) hours as needed for wheezing or shortness of breath. 62.5 mL 0  . levalbuterol (XOPENEX) 0.63 MG/3ML nebulizer solution 1 neb every 6 hours when needed 225 mL 3  . montelukast (SINGULAIR) 10 MG tablet TAKE 1 TABLET BY MOUTH AT BEDTIME 90 tablet 1  . oseltamivir (TAMIFLU) 75 MG capsule Take 1 capsule (75 mg total) by mouth 2 (two) times daily. 10 capsule 0  . predniSONE (DELTASONE) 10 MG tablet 6 tabs x 2 days, 5 x 2 days, 4 x 2 days, 3 x 2 days, 2 x 2 days, 1 x 2 days, then stop. 42 tablet 0   No current facility-administered medications on file prior to visit.

## 2015-12-14 NOTE — Telephone Encounter (Signed)
ATC, NA and unable to leave msg

## 2015-12-14 NOTE — Telephone Encounter (Signed)
Attempted to contact pt. No answer, no option to leave a message. Will try back.  

## 2015-12-15 ENCOUNTER — Telehealth: Payer: Self-pay | Admitting: Certified Nurse Midwife

## 2015-12-15 MED ORDER — PREDNISONE 10 MG PO TABS: ORAL_TABLET | ORAL | 0 refills | Status: DC

## 2015-12-15 NOTE — Telephone Encounter (Signed)
Patient returned call. Requesting annual exam prior to 12-29-15 due to required Bio-metrics exam in order to qualify for [employer] thru employer. Advised that often these exams require more information than our office,  as GYN practice, can provide. She states [employer] says all she needs is height , weight, BMI and blood glucose that should be done with annual and her insurance covers annual exams at anytime in a calendar year. Since last annual 05-01-2015, only 7.5 months ago, recommend nurse visit for completion of bio-metrics form. Patient states she does have a form, she just needs to have an annual and she wants to have this done to get back on schedule of having it in the middle of the year. Advised will review with administration and lead physician and call patient back.

## 2015-12-15 NOTE — Telephone Encounter (Signed)
Last annual exam 05-01-15 with Debbi leonard CNM.Marland Kitchen Return call to patient. Left message to call back.

## 2015-12-15 NOTE — Telephone Encounter (Signed)
lmomtcb x2 for pt 

## 2015-12-15 NOTE — Telephone Encounter (Signed)
A representative from [facility] calling to speak with you regarding previous message from patient. [phone].

## 2015-12-15 NOTE — Telephone Encounter (Signed)
Called and spoke with pt and she is aware of CY recs.  She is aware that she will need to speak with her PCP about the new referral to a new allergy specialist.  She is aware of the prednisone taper that has been sent to her local pharmacy. Nothing further is needed.

## 2015-12-15 NOTE — Telephone Encounter (Signed)
Patient wants to have a bio metric exam done when she has her annual. She is on a calendar year so she can have her annual any time. Is this something we can do?

## 2015-12-16 ENCOUNTER — Telehealth: Payer: Self-pay | Admitting: Internal Medicine

## 2015-12-16 ENCOUNTER — Other Ambulatory Visit: Payer: Self-pay | Admitting: Internal Medicine

## 2015-12-16 NOTE — Telephone Encounter (Signed)
Reviewed with Dr Sabra Heck. Advised  patient may have nurse visit to complete height, weight, BP and labs. No indication for annual exam at this time. Return call to patient. Patient verbalized frustration that we are managing her health care needs and declined appointment. Karmen Bongo RN present in my office for 9 minute call.

## 2015-12-17 NOTE — Telephone Encounter (Signed)
Med denied b/c she is overdue for rov and not seen in over 1 yr  Needs ov  LMTCB

## 2015-12-18 MED ORDER — MONTELUKAST SODIUM 10 MG PO TABS: 10.0000 mg | ORAL_TABLET | Freq: Every day | ORAL | 0 refills | Status: DC

## 2015-12-18 NOTE — Telephone Encounter (Signed)
Spoke with the pt and scheduled rov  I refilled med x 1 only  Nothing further needed

## 2015-12-21 ENCOUNTER — Ambulatory Visit (INDEPENDENT_AMBULATORY_CARE_PROVIDER_SITE_OTHER): Payer: 59 | Admitting: Internal Medicine

## 2015-12-21 ENCOUNTER — Other Ambulatory Visit (INDEPENDENT_AMBULATORY_CARE_PROVIDER_SITE_OTHER): Payer: 59

## 2015-12-21 ENCOUNTER — Encounter: Payer: Self-pay | Admitting: Internal Medicine

## 2015-12-21 VITALS — BP 112/76 | HR 86 | Ht 63.5 in | Wt 209.0 lb

## 2015-12-21 DIAGNOSIS — J4541 Moderate persistent asthma with (acute) exacerbation: Secondary | ICD-10-CM | POA: Diagnosis not present

## 2015-12-21 DIAGNOSIS — J324 Chronic pansinusitis: Secondary | ICD-10-CM

## 2015-12-21 DIAGNOSIS — J4540 Moderate persistent asthma, uncomplicated: Secondary | ICD-10-CM

## 2015-12-21 LAB — CBC WITH DIFFERENTIAL/PLATELET
BASOS PCT: 0.4 % (ref 0.0–3.0)
Basophils Absolute: 0 10*3/uL (ref 0.0–0.1)
EOS ABS: 0.7 10*3/uL (ref 0.0–0.7)
EOS PCT: 7.6 % — AB (ref 0.0–5.0)
HEMATOCRIT: 42.5 % (ref 36.0–46.0)
HEMOGLOBIN: 14.3 g/dL (ref 12.0–15.0)
LYMPHS PCT: 31.2 % (ref 12.0–46.0)
Lymphs Abs: 2.7 10*3/uL (ref 0.7–4.0)
MCHC: 33.7 g/dL (ref 30.0–36.0)
MCV: 90.6 fl (ref 78.0–100.0)
MONO ABS: 0.6 10*3/uL (ref 0.1–1.0)
Monocytes Relative: 7.4 % (ref 3.0–12.0)
NEUTROS ABS: 4.7 10*3/uL (ref 1.4–7.7)
Neutrophils Relative %: 53.4 % (ref 43.0–77.0)
Platelets: 299 10*3/uL (ref 150.0–400.0)
RBC: 4.7 Mil/uL (ref 3.87–5.11)
RDW: 13 % (ref 11.5–15.5)
WBC: 8.8 10*3/uL (ref 4.0–10.5)

## 2015-12-21 NOTE — Progress Notes (Signed)
Patient ID: Amy Rollins, female    DOB: 1961-02-25, 55 y.o.   MRN: [id number]  HPI F never smoker followed for asthma, allergic rhinitis PFT: 01/28/2010 within normal limits with insignificant response to bronchodilator. Mild decrease in diffusion capacity. FVC 2.70/81%, FEV1 2.22/87%, FEV1/FEC 0.82. TLC 90%, DLCO 75%.  11/07/14-  53 yoF never smoker followed for asthma, allergic rhinitis follows for: Pt here to discuss [place] options. Pt reports she is still wheezing, ear aches at times, HA.  CT max fac 10/20/14- IMPRESSION: Diffuse sinus disease changes as above. When compared to the previous exam, ethmoid and LEFT maxillary changes appear stable, previously seen RIGHT maxillary sinus air-fluid level resolved, and frontal and sphenoid disease changes have increased. Electronically Signed  By: Lavonia Dana M.D.  On: 10/20/2014 09:37 Allergy profile 10/20/14- Total IgE 263, elevated grass pollens CBC- 10/20/14  EOS absolute 0.8 (H) She found Anoro inhaler sample no better than BREO Ellipta so she continues BREO. After Augmentin she has less drainage. Still wheezes. 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  12/21/2015-55 year old female never smoker followed for Asthma, allergic rhinitis, chronic rhinosinusitis FOLLOWS FOR: pt states after prednisone taper & z pack in 07/2015 breathing improved until catching a virus around 09/2015. c/o increased wheezing, sob w/exertion & mild prod cough w/yellow mucus ENT evaluation 04/2015-office note in EMR offered surgery for extensive sinus disease with nasal polyps. Increased rhinitis and asthma in spring associated with tree pollen season. Daily use of rescue inhaler especially before exertion. Daily wheezing. History of skin cancers including melanoma so she was reluctant to try Xolair although she did qualify. No history of shingles. We discussed Nucala. No indoor pets, hardwood floors, no moisture or  mold problem in home. Recent cold, resolving. Office Spirometry-can't extract results from EMR at time of this dictation  Review of Systems-see HPI Constitutional:   No-   weight loss, night sweats, fevers, chills, fatigue, lassitude. HEENT:   + headaches, No difficulty swallowing, tooth/dental problems,  sore throat,       No-  sneezing, itching, ear ache, +nasal congestion, post nasal drip,  CV:        chest pain, orthopnea, PND, swelling in lower extremities, anasarca, dizziness, palpitations Resp: No-   shortness of breath with exertion or at rest.             No- productive cough,  No non-productive cough,  No- coughing up of blood.              No-change in color of mucus.  + wheezing.   Skin: No-   rash or lesions. GI:  No-   heartburn, indigestion, abdominal pain, nausea, vomiting, GU:  MS:  No-   joint pain or swelling.  Neuro-     nothing unusual Psych:  No- change in mood or affect. No depression or anxiety.  No memory loss.  Objective:   Physical Exam General- Alert, Oriented, Affect-appropriate, Distress- none acute, +overweight Skin- rash-none, lesions- none, excoriation- none Lymphadenopathy- none Head- atraumatic            Eyes- Gross vision intact, PERRLA, conjunctivae clear secretions            Ears- Hearing, canals-normal            Nose-  sniffing, + turbinate edema, no-Septal dev,  polyps, erosion, perforation             Throat- Mallampati III , mucosa clear , drainage- none,  tonsils- atrophic Neck- flexible , trachea midline, no stridor , thyroid nl, carotid no bruit Chest - symmetrical excursion , unlabored           Heart/CV- RRR , no murmur , no gallop  , no rub, nl s1 s2                           - JVD- none , edema- none, stasis changes- none, varices- none           Lung- + mild wheeze bilateral, unlabored, cough-none , dullness-none, rub- none           Chest wall-  Abd-  Br/ Gen/ Rectal- Not done, not indicated Extrem- cyanosis- none, clubbing,  none, atrophy- none, strength- nl Neuro- grossly intact to observation

## 2015-12-21 NOTE — Assessment & Plan Note (Addendum)
Moderate persistent asthma with mild exacerbation. There is a significant allergic component but she is also currently resolving a cold shared with family. She hopes to avoid systemic steroid therapy saying it makes her "very mean" Nucala is a significant option noting previous eosinophilia. We reviewed risks and potential complications as listed in [facility]. Plan-offer Nucala application and inflammation. Lab for CBC with differential and IgE, update allergy profile

## 2015-12-21 NOTE — Assessment & Plan Note (Signed)
ENT told her she had nasal polyps but they are not visible anteriorly on this exam. No history of aspirin allergy. If she were to try Nucala therapy I would hope she would see some improvement in her rhinitis. Verapamil therapy might be another approach.

## 2015-12-21 NOTE — Patient Instructions (Addendum)
Order- lab- Allergy Profile, CBC w diff      Dx asthma moderate persistent  Order- Nucala application protocol

## 2015-12-22 LAB — RESPIRATORY ALLERGY PROFILE REGION II ~~LOC~~
Allergen, C. Herbarum, M2: <0.1 kU/L
Allergen, Cottonwood, t14: <0.1 kU/L
Allergen, D pternoyssinus,d7: <0.1 kU/L
Allergen, Mulberry, t76: <0.1 kU/L
Allergen, Oak,t7: <0.1 kU/L
Bermuda Grass: <0.1 kU/L
Cat Dander: <0.1 kU/L
Cockroach: <0.1 kU/L
D. farinae: <0.1 kU/L
Dog Dander: <0.1 kU/L
Elm IgE: <0.1 kU/L
IgE (Immunoglobulin E), Serum: 162 kU/L — ABNORMAL HIGH (ref <115)
Johnson Grass: <0.1 kU/L
TIMOTHY GRASS: 0.27 kU/L — AB

## 2015-12-24 ENCOUNTER — Telehealth: Payer: Self-pay | Admitting: Obstetrics & Gynecology

## 2015-12-24 ENCOUNTER — Telehealth: Payer: Self-pay | Admitting: Internal Medicine

## 2015-12-24 NOTE — Telephone Encounter (Signed)
Amy Rollins from [facility] calling to give information on what is required for Biometric Screening, Cholesterol, Glucose or A1C, Height and Weight. Patient will be calling to schedule appointment.

## 2015-12-24 NOTE — Telephone Encounter (Signed)
Notes Recorded by Inge Rise, CMA on 12/24/2015 at 2:30 PM EDT [facility] x1 ------ Notes Recorded by Deneise Lever, MD on 12/24/2015 at 1:21 PM EDT Allergy labs- IgE antibody and Eosinophil cell count are again high in range to qualify for Nucala injection therapy. The forms have been sent to the [employer]. ------------------------------------------------------------------------------  Pt aware of results. Nothing further needed.

## 2015-12-28 ENCOUNTER — Telehealth: Payer: Self-pay | Admitting: Internal Medicine

## 2015-12-30 NOTE — Telephone Encounter (Signed)
PA in process and [facility] [place] is aware.

## 2016-01-05 NOTE — Telephone Encounter (Signed)
Left message to call Gabriell Casimir at [phone]. 

## 2016-01-06 ENCOUNTER — Telehealth: Payer: Self-pay | Admitting: Internal Medicine

## 2016-01-06 MED ORDER — DOXYCYCLINE HYCLATE 100 MG PO TABS: 100.0000 mg | ORAL_TABLET | Freq: Two times a day (BID) | ORAL | 0 refills | Status: DC

## 2016-01-06 NOTE — Telephone Encounter (Signed)
Spoke with the pt and notified of recs per CDY  She verbalized understanding  Nothing further needed Rx was sent to pharm

## 2016-01-06 NOTE — Telephone Encounter (Signed)
Spoke with patient. Calling to check on patient scheduling appointment for biometric screening with our office per [facility]. Patient states she has scheduled an appointment with her PCP to have this performed. States [employer] reached out to our office many times before and "Your office ignored the phone calls. I was not pleased about how this was handled at all. I am an established patient and it should not have been this hard." Reviewed previous note from 12/15/2015. Patient's last aex was on 05/01/2015. This was reviewed with Dr.Miller and patient was offered nurse visit for completion of form. Patient did not want to schedule as she requested aex at the same time. Per Dr.Miller no indication for aex at this time. Advised patient we would be happy to complete form with nurse visit. Patient would like to keep her appointment with her PCP for completion of this form.  Routing to provider for final review. Patient agreeable to disposition. Will close encounter.

## 2016-01-06 NOTE — Telephone Encounter (Signed)
Could be some virus, bu doubt flu. Offer doxycycline 100 mg, # 14, 1 twice daily

## 2016-01-06 NOTE — Telephone Encounter (Signed)
Spoke with the pt  She states she started feeling bad 8/21  She c/o fever (currently 101.6 and highest 103), aches, HA "all over not just sinus",non prod cough and bloody nasal dc She is wondering if it is possible if she has the flu and what CDY can rec for her  Please advise thanks! Allergies  Allergen Reactions  . Levofloxacin     REACTION: severe-hives,edema,throat closed up  . Pneumococcal Vaccines     Per MD-never take again; red area under and around arm   Current Outpatient Prescriptions on File Prior to Visit  Medication Sig Dispense Refill  . albuterol (PROAIR HFA) 108 (90 BASE) MCG/ACT inhaler Inhale 2 puffs into the lungs every 4 (four) hours as needed for wheezing or shortness of breath. 3 Inhaler 1  . Cyanocobalamin (VITAMIN B-12) 2500 MCG SUBL Place 1 tablet under the tongue daily.    . fluticasone (FLONASE) 50 MCG/ACT nasal spray PLACE 2 SPRAYS INTO THE NOSE TWICE A DAY 48 g 1  . fluticasone furoate-vilanterol (BREO ELLIPTA) 200-25 MCG/INH AEPB Inhale 1 puff into the lungs daily. 60 each 1  . ipratropium (ATROVENT) 0.02 % nebulizer solution Take 2.5 mLs (0.5 mg total) by nebulization every 6 (six) hours as needed for wheezing or shortness of breath. 62.5 mL 0  . levalbuterol (XOPENEX) 0.63 MG/3ML nebulizer solution 1 neb every 6 hours when needed 225 mL 3  . montelukast (SINGULAIR) 10 MG tablet Take 1 tablet (10 mg total) by mouth at bedtime. 90 tablet 0   No current facility-administered medications on file prior to visit.

## 2016-01-08 DIAGNOSIS — R945 Abnormal results of liver function studies: Secondary | ICD-10-CM

## 2016-01-08 DIAGNOSIS — R7989 Other specified abnormal findings of blood chemistry: Secondary | ICD-10-CM | POA: Insufficient documentation

## 2016-01-08 NOTE — Telephone Encounter (Signed)
Katie any updates on the PA?  thanks

## 2016-01-14 NOTE — Telephone Encounter (Signed)
[facility]   PA approved from 12-30-15 through 12-29-16.   [facility] has attempted to reach patient several times this month without success. I have contacted the patient requesting she call me back.   $0 copay for Nucala.

## 2016-01-15 ENCOUNTER — Telehealth: Payer: Self-pay | Admitting: Internal Medicine

## 2016-01-15 NOTE — Telephone Encounter (Signed)
Spoke with SN in CY's absence. He states patient needs to stop Doxycycline today, use Benadryl for itching of skin on back, and start Prednisone taper. Pt has Prednisone taper from CY on hand at home and will start in AM taking Pred 10 mg taking 4 tabs x 2 days, 3 x  2 days, 2 x 2 days, 1 x 2 days, then stop. Also, patient needs to be seen by CY. Pt made an appt on 01-21-16 at 11:30am

## 2016-01-16 ENCOUNTER — Other Ambulatory Visit: Payer: Self-pay | Admitting: Internal Medicine

## 2016-01-21 ENCOUNTER — Telehealth: Payer: Self-pay | Admitting: Internal Medicine

## 2016-01-21 ENCOUNTER — Ambulatory Visit (INDEPENDENT_AMBULATORY_CARE_PROVIDER_SITE_OTHER): Payer: 59 | Admitting: Internal Medicine

## 2016-01-21 ENCOUNTER — Encounter: Payer: Self-pay | Admitting: Internal Medicine

## 2016-01-21 VITALS — BP 124/78 | HR 80 | Ht 64.0 in | Wt 202.0 lb

## 2016-01-21 DIAGNOSIS — J4551 Severe persistent asthma with (acute) exacerbation: Secondary | ICD-10-CM | POA: Diagnosis not present

## 2016-01-21 DIAGNOSIS — J4541 Moderate persistent asthma with (acute) exacerbation: Secondary | ICD-10-CM

## 2016-01-21 DIAGNOSIS — Z23 Encounter for immunization: Secondary | ICD-10-CM | POA: Diagnosis not present

## 2016-01-21 MED ORDER — MEPOLIZUMAB 100 MG ~~LOC~~ SOLR
100.0000 mg | SUBCUTANEOUS | Status: DC
Start: 2016-01-21 — End: 2019-01-01

## 2016-01-21 NOTE — Progress Notes (Signed)
Patient ID: Amy Rollins, female    DOB: 06/13/1960, 55 y.o.   MRN: [id number]  HPI F never smoker followed for asthma, allergic rhinitis PFT: 01/28/2010 within normal limits with insignificant response to bronchodilator. Mild decrease in diffusion capacity. FVC 2.70/81%, FEV1 2.22/87%, FEV1/FEC 0.82. TLC 90%, DLCO 75%.  11/07/14-  53 yoF never smoker followed for asthma, allergic rhinitis follows for: Pt here to discuss [place] options. Pt reports she is still wheezing, ear aches at times, HA.  CT max fac 10/20/14- IMPRESSION: Diffuse sinus disease changes as above. When compared to the previous exam, ethmoid and LEFT maxillary changes appear stable, previously seen RIGHT maxillary sinus air-fluid level resolved, and frontal and sphenoid disease changes have increased. Electronically Signed  By: Lavonia Dana M.D.  On: 10/20/2014 09:37 Allergy profile 10/20/14- Total IgE 263, elevated grass pollens CBC- 10/20/14  EOS absolute 0.8 (H) She found Anoro inhaler sample no better than BREO Ellipta so she continues BREO. After Augmentin she has less drainage. Still wheezes. 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  12/21/2015-55 year old female never smoker followed for Asthma, allergic rhinitis, chronic rhinosinusitis FOLLOWS FOR: pt states after prednisone taper & z pack in 07/2015 breathing improved until catching a virus around 09/2015. c/o increased wheezing, sob w/exertion & mild prod cough w/yellow mucus ENT evaluation 04/2015-office note in EMR offered surgery for extensive sinus disease with nasal polyps. Increased rhinitis and asthma in spring associated with tree pollen season. Daily use of rescue inhaler especially before exertion. Daily wheezing. History of skin cancers including melanoma so she was reluctant to try Xolair although she did qualify. No history of shingles. We discussed Nucala. No indoor pets, hardwood floors, no moisture or  mold problem in home. Recent cold, resolving. Office Spirometry-can't extract results from EMR at time of this dictation\  01/21/2016-55 year old female never smoker followed for Asthma, allergic rhinitis, chronic rhinosinusitis ACUTE VISIT: Pt states she feels better since taking Prednisone taper recently; rash not itching as much now and just bumps. Pt states she continues to have headache and pressure in head when bending over.  Office Spirometry 12/21/2015-moderate restrictive and obstructive changes with reduced exhaled volume. FVC 1.73/51%, FEV1 1.33/50%, ratio 77%, FEF 25-75 1.10/43% Recent doxycycline prescription was stopped when she developed rash on her back and increased wheeze. She took prednisone taper that she had on hand. Now down to 30 mg daily and tapering. Febrile illness late August-she is not sure was bronchitis or kidney infection. PCP did labs. Brother is on [place]. She is interested in trying it based on our earlier discussions.  Review of Systems-see HPI Constitutional:   No-   weight loss, night sweats, fevers, chills, fatigue, lassitude. HEENT:   + headaches, No difficulty swallowing, tooth/dental problems,  sore throat,       No-  sneezing, itching, ear ache, +nasal congestion, post nasal drip,  CV:        chest pain, orthopnea, PND, swelling in lower extremities, anasarca, dizziness, palpitations Resp: No-   shortness of breath with exertion or at rest.             No- productive cough,  No non-productive cough,  No- coughing up of blood.              No-change in color of mucus.  + wheezing.   Skin: No-   rash or lesions. GI:  No-   heartburn, indigestion, abdominal pain, nausea, vomiting, GU:  MS:  No-  joint pain or swelling.  Neuro-     nothing unusual Psych:  No- change in mood or affect. No depression or anxiety.  No memory loss.  Objective:   Physical Exam General- Alert, Oriented, Affect-appropriate, Distress- none acute, +overweight Skin- rash-none  seen at this visit, lesions- none, excoriation- none Lymphadenopathy- none Head- atraumatic            Eyes- Gross vision intact, PERRLA, conjunctivae clear secretions            Ears- Hearing, canals-normal            Nose-  sniffing, + turbinate edema, no-Septal dev,  polyps, erosion, perforation             Throat- Mallampati III , mucosa clear , drainage- none, tonsils- atrophic Neck- flexible , trachea midline, no stridor , thyroid nl, carotid no bruit Chest - symmetrical excursion , unlabored           Heart/CV- RRR , no murmur , no gallop  , no rub, nl s1 s2                           - JVD- none , edema- none, stasis changes- none, varices- none           Lung- clear, unlabored, cough-none , dullness-none, rub- none           Chest wall-  Abd-  Br/ Gen/ Rectal- Not done, not indicated Extrem- cyanosis- none, clubbing, none, atrophy- none, strength- nl Neuro- grossly intact to observation

## 2016-01-21 NOTE — Patient Instructions (Signed)
We will start Nucala hoping for better long term control of your asthma  Ok to finish the prednisone taper as planned

## 2016-01-21 NOTE — Telephone Encounter (Signed)
lmomtcb x1 for pt 

## 2016-01-21 NOTE — Telephone Encounter (Signed)
Per today's OV: Return in about 3 months (around 04/21/2016). Schedule has no 30 min OV's.   Please advise Joellen Jersey thanks

## 2016-01-21 NOTE — Telephone Encounter (Signed)
04/21/16 at 4:15pm slot can be used.

## 2016-01-22 NOTE — Telephone Encounter (Signed)
Per CY-pt will start Nucala and pt is aware to contact her pharmacy to approve shipment of Nucala to our office. Nothing more needed at this time.    Pt aware that I will contact her once medication is here in the office.

## 2016-01-22 NOTE — Telephone Encounter (Signed)
lmtcb x2 for pt. 

## 2016-01-24 NOTE — Assessment & Plan Note (Signed)
Now tapering prednisone. Recurrent episodes of asthma/bronchitis often requiring steroid taper despite maintenance bronchodilators and ICS. Plan-start Nucala application process. Patient education. Finish prednisone taper.

## 2016-01-25 NOTE — Telephone Encounter (Signed)
lmtcb x3 for pt. 

## 2016-01-26 NOTE — Telephone Encounter (Signed)
lmtcb x4 for pt. Per triage protocol, message will be closed. 

## 2016-01-27 ENCOUNTER — Telehealth: Payer: Self-pay | Admitting: Internal Medicine

## 2016-01-27 NOTE — Telephone Encounter (Signed)
lmtcb X1 for pt  

## 2016-01-28 NOTE — Telephone Encounter (Signed)
Ok to use Sudafed as a decongestant  It may help her to use otc flonase nasal spray   1-2 puffs each nostril once or twice every day  It may help to use an otc nasal saline gel as needed for dy nose. Every drug store has several brands.

## 2016-01-28 NOTE — Telephone Encounter (Signed)
LMTCB for pt 

## 2016-01-28 NOTE — Telephone Encounter (Signed)
LMTCB x 1 

## 2016-01-28 NOTE — Telephone Encounter (Signed)
Spoke with pt. States that she has finished prednisone but her nose is still congested. Pt would like to know if there is a decongestant and something she can put in her nose to help with the dryness she is having.  Allergies  Allergen Reactions  . Levofloxacin     REACTION: severe-hives,edema,throat closed up  . Pneumococcal Vaccines     Per MD-never take again; red area under and around arm   Current Outpatient Prescriptions on File Prior to Visit  Medication Sig Dispense Refill  . albuterol (PROAIR HFA) 108 (90 BASE) MCG/ACT inhaler Inhale 2 puffs into the lungs every 4 (four) hours as needed for wheezing or shortness of breath. 3 Inhaler 1  . Cyanocobalamin (VITAMIN B-12) 2500 MCG SUBL Place 1 tablet under the tongue daily.    . fluticasone (FLONASE) 50 MCG/ACT nasal spray USE 2 SPRAYS IN EACH NOSTRIL TWICE A DAY 16 g 1  . fluticasone furoate-vilanterol (BREO ELLIPTA) 200-25 MCG/INH AEPB Inhale 1 puff into the lungs daily. 60 each 1  . ipratropium (ATROVENT) 0.02 % nebulizer solution Take 2.5 mLs (0.5 mg total) by nebulization every 6 (six) hours as needed for wheezing or shortness of breath. 62.5 mL 0  . levalbuterol (XOPENEX) 0.63 MG/3ML nebulizer solution 1 neb every 6 hours when needed 225 mL 3  . montelukast (SINGULAIR) 10 MG tablet Take 1 tablet (10 mg total) by mouth at bedtime. 90 tablet 0   Current Facility-Administered Medications on File Prior to Visit  Medication Dose Route Frequency Provider Last Rate Last Dose  . Mepolizumab SOLR 100 mg  100 mg Subcutaneous Q28 days Deneise Lever, MD        CY - please advise. Thanks.

## 2016-01-28 NOTE — Telephone Encounter (Signed)
Patient returning our call - She can be reached at [phone]

## 2016-01-28 NOTE — Telephone Encounter (Signed)
Pt returning call.Amy Rollins ° °

## 2016-01-28 NOTE — Telephone Encounter (Signed)
lmtcb x2 for pt.  Please see telephone encounter 01/21/16 when pt calls back, she needs to be scheduled for her 3 month ROV.

## 2016-01-29 NOTE — Telephone Encounter (Signed)
LMTCB x2  

## 2016-01-29 NOTE — Telephone Encounter (Signed)
Spoke with pt and gave recommendations. Also instructed in proper nasal spray use. She will get some Sudafed and nasal saline gel to see if that helps. She will call if not improving or symptoms worsen. Nothing further needed.

## 2016-02-05 ENCOUNTER — Other Ambulatory Visit: Payer: Self-pay | Admitting: Internal Medicine

## 2016-02-12 ENCOUNTER — Other Ambulatory Visit: Payer: Self-pay | Admitting: Internal Medicine

## 2016-02-23 ENCOUNTER — Ambulatory Visit: Payer: 59 | Admitting: Allergy and Immunology

## 2016-03-09 ENCOUNTER — Telehealth: Payer: Self-pay | Admitting: Internal Medicine

## 2016-03-09 NOTE — Telephone Encounter (Signed)
lmtcb

## 2016-03-10 NOTE — Telephone Encounter (Signed)
lmtcb x1 for pt. 

## 2016-03-10 NOTE — Telephone Encounter (Signed)
Patient is returning call, please call cell [phone].

## 2016-03-11 MED ORDER — PREDNISONE 10 MG PO TABS
ORAL_TABLET | ORAL | 0 refills | Status: DC
Start: 2016-03-11 — End: 2016-04-21

## 2016-03-11 MED ORDER — AZITHROMYCIN 250 MG PO TABS: ORAL_TABLET | ORAL | 0 refills | Status: AC

## 2016-03-11 NOTE — Telephone Encounter (Signed)
Offer Zpak   # 6,  2 today then one daily           Prednisone 10 mg, # 20, 4 X 2 DAYS, 3 X 2 DAYS, 2 X 2 DAYS, 1 X 2 DAYS  

## 2016-03-11 NOTE — Telephone Encounter (Signed)
Spoke with pt. She is aware of CY's recommendations. Rxs have been sent in. Nothing further was needed. 

## 2016-03-11 NOTE — Telephone Encounter (Signed)
Patient called back upset that we still haven't address her issue - she would like a call back on her cell phone - phone disconnected before getting a good number - pr

## 2016-03-11 NOTE — Telephone Encounter (Signed)
Spoke with pt. States that she is having an asthma flare up. Reports wheezing, SOB, cough, wheezing, stuffy nose and fever. Cough is producing yellow mucus. Temp has been as high as 101. Symptoms started 6 days. Has been taking Tylenol Cold medicine. Would like CY's recommendations. CY please advise. Thanks.  Allergies  Allergen Reactions  . Levofloxacin     REACTION: severe-hives,edema,throat closed up  . Pneumococcal Vaccines     Per MD-never take again; red area under and around arm   Current Outpatient Prescriptions on File Prior to Visit  Medication Sig Dispense Refill  . albuterol (PROAIR HFA) 108 (90 BASE) MCG/ACT inhaler Inhale 2 puffs into the lungs every 4 (four) hours as needed for wheezing or shortness of breath. 3 Inhaler 1  . BREO ELLIPTA 200-25 MCG/INH AEPB INHALE 1 PUFF INTO THE LUNGS DAILY. 180 each 2  . Cyanocobalamin (VITAMIN B-12) 2500 MCG SUBL Place 1 tablet under the tongue daily.    . fluticasone (FLONASE) 50 MCG/ACT nasal spray USE 2 SPRAYS IN EACH NOSTRIL TWICE A DAY 16 g 1  . fluticasone (FLONASE) 50 MCG/ACT nasal spray USE 2 SPRAYS IN EACH NOSTRIL TWICE A DAY 48 g 1  . ipratropium (ATROVENT) 0.02 % nebulizer solution Take 2.5 mLs (0.5 mg total) by nebulization every 6 (six) hours as needed for wheezing or shortness of breath. 62.5 mL 0  . levalbuterol (XOPENEX) 0.63 MG/3ML nebulizer solution 1 neb every 6 hours when needed 225 mL 3  . montelukast (SINGULAIR) 10 MG tablet Take 1 tablet (10 mg total) by mouth at bedtime. 90 tablet 0   Current Facility-Administered Medications on File Prior to Visit  Medication Dose Route Frequency Provider Last Rate Last Dose  . Mepolizumab SOLR 100 mg  100 mg Subcutaneous Q28 days Deneise Lever, MD

## 2016-03-24 ENCOUNTER — Ambulatory Visit: Payer: 59 | Admitting: Internal Medicine

## 2016-03-24 ENCOUNTER — Telehealth: Payer: Self-pay | Admitting: Internal Medicine

## 2016-03-24 NOTE — Telephone Encounter (Signed)
Will forward this message to [facility] to place the handicap placard on CY cart tomorrow to be signed. Will need to call the pt once done.  thanks

## 2016-03-25 NOTE — Telephone Encounter (Signed)
CY have you completed handicap form?

## 2016-03-25 NOTE — Telephone Encounter (Signed)
rx placed up front for pickup.  Pt aware.  Nothing further needed.

## 2016-04-04 ENCOUNTER — Other Ambulatory Visit: Payer: Self-pay | Admitting: Physician Assistant

## 2016-04-04 DIAGNOSIS — Z1231 Encounter for screening mammogram for malignant neoplasm of breast: Secondary | ICD-10-CM

## 2016-04-12 ENCOUNTER — Other Ambulatory Visit: Payer: Self-pay | Admitting: Internal Medicine

## 2016-04-14 ENCOUNTER — Other Ambulatory Visit: Payer: Self-pay | Admitting: Internal Medicine

## 2016-04-15 ENCOUNTER — Telehealth: Payer: Self-pay | Admitting: Internal Medicine

## 2016-04-15 MED ORDER — ALBUTEROL SULFATE HFA 108 (90 BASE) MCG/ACT IN AERS: 2.0000 | INHALATION_SPRAY | RESPIRATORY_TRACT | 1 refills | Status: DC | PRN

## 2016-04-15 NOTE — Telephone Encounter (Signed)
Pt needs 90 day supply of proair sent to [facility] on [address].  This has been sent.  Nothing further needed.

## 2016-04-15 NOTE — Telephone Encounter (Signed)
Patient called insurance will only pay for ProAir and she needs 90 day supply - Patient can be reached at [phone] or [phone] -pr

## 2016-04-21 ENCOUNTER — Ambulatory Visit (INDEPENDENT_AMBULATORY_CARE_PROVIDER_SITE_OTHER): Payer: 59 | Admitting: Internal Medicine

## 2016-04-21 ENCOUNTER — Encounter: Payer: Self-pay | Admitting: Internal Medicine

## 2016-04-21 DIAGNOSIS — J4540 Moderate persistent asthma, uncomplicated: Secondary | ICD-10-CM | POA: Diagnosis not present

## 2016-04-21 DIAGNOSIS — J302 Other seasonal allergic rhinitis: Secondary | ICD-10-CM | POA: Diagnosis not present

## 2016-04-21 DIAGNOSIS — J3089 Other allergic rhinitis: Secondary | ICD-10-CM | POA: Diagnosis not present

## 2016-04-21 DIAGNOSIS — J324 Chronic pansinusitis: Secondary | ICD-10-CM

## 2016-04-21 NOTE — Progress Notes (Signed)
Patient ID: Amy Rollins, female    DOB: Dec 12, 1960, 54 y.o.   MRN: [id number]  HPI F never smoker followed for asthma, allergic rhinitis PFT: 01/28/2010 within normal limits with insignificant response to bronchodilator. Mild decrease in diffusion capacity. FVC 2.70/81%, FEV1 2.22/87%, FEV1/FEC 0.82. TLC 90%, DLCO 75%. CT max fac 10/20/14- IMPRESSION: Diffuse sinus disease changes as above Allergy profile 10/20/14- Total IgE 263, elevated grass pollens CBC- 10/20/14  EOS absolute 0.8 (H) Office Spirometry 12/21/2015-moderate restrictive and obstructive changes with reduced exhaled volume. FVC 1.73/51%, FEV1 1.33/50%, ratio 77%, FEF 25-75 1.10/43% FENO 04/21/16- 28, she reports score 15 at her allergy office. ---------------------------------    01/21/2016-55 year old female never smoker followed for Asthma, allergic rhinitis, chronic rhinosinusitis ACUTE VISIT: Pt states she feels better since taking Prednisone taper recently; rash not itching as much now and just bumps. Pt states she continues to have headache and pressure in head when bending over.  Office Spirometry 12/21/2015-moderate restrictive and obstructive changes with reduced exhaled volume. FVC 1.73/51%, FEV1 1.33/50%, ratio 77%, FEF 25-75 1.10/43% Recent doxycycline prescription was stopped when she developed rash on her back and increased wheeze. She took prednisone taper that she had on hand. Now down to 30 mg daily and tapering. Febrile illness late August-she is not sure was bronchitis or kidney infection. PCP did labs. Brother is on [place]. She is interested in trying it based on our earlier discussions.  04/21/2016-55 year old female never smoker followed for Asthma, allergic rhinitis, chronic rhinosinusitis FOLLOWS FOR: Pt states she is doing well;slight cough and started allergy shots through [facility]([place]).  Pt did decide NOT to start Nucala. She feels well today. Mentions residual decreased  sense of smell and minor nasal stuffiness. Very occasional mild heartburn. We discussed known chronic rhinosinusitis and reflux as potential aggravators of asthma FENO 04/21/16- 28, she reports score 15 at her allergy office.  Review of Systems-see HPI Constitutional:   No-   weight loss, night sweats, fevers, chills, fatigue, lassitude. HEENT:    headaches, No difficulty swallowing, tooth/dental problems,  sore throat,       No-  sneezing, itching, ear ache, +nasal congestion, post nasal drip,  CV:        chest pain, orthopnea, PND, swelling in lower extremities, anasarca, dizziness, palpitations Resp: No-   shortness of breath with exertion or at rest.             No- productive cough,  No non-productive cough,  No- coughing up of blood.              No-change in color of mucus.  + wheezing.   Skin: No-   rash or lesions. GI:  No-   heartburn, indigestion, abdominal pain, nausea, vomiting, GU:  MS:  No-   joint pain or swelling.  Neuro-     nothing unusual Psych:  No- change in mood or affect. No depression or anxiety.  No memory loss.  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            Eyes- Gross vision intact, PERRLA, conjunctivae clear secretions            Ears- Hearing, canals-normal            Nose-  sniffing, + turbinate edema, no-Septal dev,  polyps, erosion, perforation  Throat- Mallampati III , mucosa clear , drainage- none, tonsils- atrophic Neck- flexible , trachea midline, no stridor , thyroid nl, carotid no bruit Chest - symmetrical excursion , unlabored           Heart/CV- RRR , no murmur , no gallop  , no rub, nl s1 s2                           - JVD- none , edema- none, stasis changes- none, varices- none           Lung- clear, unlabored, cough-none , dullness-none,  rub- none           Chest wall-  Abd-  Br/ Gen/ Rectal- Not done, not indicated Extrem- cyanosis- none, clubbing, none, atrophy- none, strength- nl Neuro- grossly intact to observation

## 2016-04-21 NOTE — Patient Instructions (Signed)
Order- FENO   Dx asthma moderate persistent, uncomplicated  Please call if we can help  Pay attention to reflux and sinus symptoms that might be related to asthma control

## 2016-04-22 NOTE — Assessment & Plan Note (Signed)
Chronic rhinosinusitis can keep the lower airway irritated and lower the threshold for asthma. Keep in mind also possibility of eosinophilic granulomatous angiitis. Currently symptoms are negligible.

## 2016-04-22 NOTE — Assessment & Plan Note (Signed)
Now successfully established with [place] [facility] at [facility]. I suggested to her that she could have them manage her asthma.

## 2016-04-22 NOTE — Assessment & Plan Note (Signed)
No recent exacerbation, feels well today and chest is clear. She indicated her intention to follow here at long intervals "for my lungs". Certainly her allergy office will be able to help with asthma  but we will schedule a return in a year, or as needed. We will keep "Biologics" like Nucala in mind in case needed in the future, but not needed now.

## 2016-04-29 ENCOUNTER — Telehealth: Payer: Self-pay | Admitting: Internal Medicine

## 2016-04-29 NOTE — Telephone Encounter (Signed)
lmtcb for pt.  

## 2016-05-02 MED ORDER — AMOXICILLIN-POT CLAVULANATE 875-125 MG PO TABS: 1.0000 | ORAL_TABLET | Freq: Two times a day (BID) | ORAL | 0 refills | Status: DC

## 2016-05-02 NOTE — Telephone Encounter (Signed)
rx sent to preferred pharmacy. atc pt on mobile # X2, no answer, no vm set up. atc pt on home number, no vm set up, automated message said "your party is not answering, please call back at a later time" and ended the line.  Wcb.

## 2016-05-02 NOTE — Telephone Encounter (Signed)
Offer augmentin 875,  # 14   1 twice daily, ref x 1

## 2016-05-02 NOTE — Telephone Encounter (Signed)
Called and spoke to pt. Pt states she thinks she has a sinus infection. Pt c/o sinus headache, sinus pressure, blowing out yellow mucus, prod cough with yellow and green mucus x 7 days. Pt denies CP/tightness and f/c/s. Pt states she is taking mucinex and tylenol. Pt states when calling her back, if she does not answer to use [facility] [address] if calling in rx and it is ok to leave detailed message.   Dr. Annamaria Boots please advise. Thanks.   Allergies  Allergen Reactions  . Doxycycline Anxiety  . Levofloxacin     REACTION: severe-hives,edema,throat closed up  . Pneumococcal Vaccines     Per MD-never take again; red area under and around arm    Current Outpatient Prescriptions on File Prior to Visit  Medication Sig Dispense Refill  . BREO ELLIPTA 200-25 MCG/INH AEPB INHALE 1 PUFF INTO THE LUNGS DAILY. 180 each 2  . Cyanocobalamin (VITAMIN B-12) 2500 MCG SUBL Place 1 tablet under the tongue daily.    Marland Kitchen EPIPEN 2-PAK 0.3 MG/0.3ML SOAJ injection See admin instructions.  1  . fluticasone (FLONASE) 50 MCG/ACT nasal spray USE 2 SPRAYS IN EACH NOSTRIL TWICE A DAY 16 g 1  . fluticasone (FLONASE) 50 MCG/ACT nasal spray USE 2 SPRAYS IN EACH NOSTRIL TWICE A DAY 48 g 1  . ipratropium (ATROVENT) 0.02 % nebulizer solution Take 2.5 mLs (0.5 mg total) by nebulization every 6 (six) hours as needed for wheezing or shortness of breath. 62.5 mL 0  . levalbuterol (XOPENEX) 0.63 MG/3ML nebulizer solution 1 neb every 6 hours when needed 225 mL 3  . montelukast (SINGULAIR) 10 MG tablet TAKE 1 TABLET (10 MG TOTAL) BY MOUTH AT BEDTIME. 90 tablet 0  . PROAIR HFA 108 (90 Base) MCG/ACT inhaler USE 2 PUFFS EVERY 4 TO 6 HOURS AS NEEDED (RESCUE) 8.5 Inhaler 0   Current Facility-Administered Medications on File Prior to Visit  Medication Dose Route Frequency Provider Last Rate Last Dose  . Mepolizumab SOLR 100 mg  100 mg Subcutaneous Q28 days Deneise Lever, MD

## 2016-05-03 NOTE — Telephone Encounter (Signed)
Called and spoke to pt. Pt states she has already picked up the Augmentin. Nothing further needed at this time.

## 2016-05-05 ENCOUNTER — Telehealth: Payer: Self-pay | Admitting: Certified Nurse Midwife

## 2016-05-05 NOTE — Telephone Encounter (Signed)
Spoke with patient. Patient states she has been having left breast pain that has been worsening since Thanksgiving. Patient denies redness or discharge. Patient states she thinks there may be a lump, but not sure. Advised patient not to press on area of tenderness or lump. Patient states she rarely drinks caffeine. Recommended OV for further evaluation, advised patient [name], CNM is out of the office tomorrow, can schedule with covering provider. Patient scheduled with [name], NP 05/06/16 at 10:15am. Patient is agreeable to date and time.  Routing to provider for final review. Patient is agreeable to disposition. Will close encounter.  Cc: [name], NP

## 2016-05-05 NOTE — Telephone Encounter (Signed)
Patient experiencing left breast pain and wants to speak with someone about it.

## 2016-05-05 NOTE — Telephone Encounter (Signed)
Mailbox full, unable to leave message

## 2016-05-06 ENCOUNTER — Encounter: Payer: Self-pay | Admitting: Nurse Practitioner

## 2016-05-06 ENCOUNTER — Ambulatory Visit (INDEPENDENT_AMBULATORY_CARE_PROVIDER_SITE_OTHER): Payer: 59 | Admitting: Nurse Practitioner

## 2016-05-06 VITALS — BP 136/84 | HR 84 | Ht 64.0 in | Wt 202.0 lb

## 2016-05-06 DIAGNOSIS — N644 Mastodynia: Secondary | ICD-10-CM

## 2016-05-06 NOTE — Progress Notes (Signed)
   Subjective:   55 y.o. Married Caucasian female presents for evaluation of left breast tenderness. She is having pain and tenderness again in left breast.  She was seen 05/01/15 for same symptoms.  The 3 D Mammogram and [place] on 05/08/15 shows no suspicious or solid mass.  There is a ridge of normal thick FCB changes at 7:00 position at the area of her tenderness.    Onset of the symptoms was before Thanksgiving. Patient sought evaluation because of breast tenderness.  Contributing factors include  risk factors breast in paternal aunt in her 16's to early 3's and has survived.  Denies no constitutional symptoms. Patient denies history of trauma, bites, or injuries.  Not sure if she may have pulled muscle in the left outer quadrant.  Does not think related to lifting, pulling. Last mammogram was 1 yr ago  Previous evaluation has included Mammogram and [place].   Review of Systems Pertinent items are noted in HPI.SUBJECTIVE   Objective:   @General  appearance: alert, cooperative, appears stated age and no distress Head: Normocephalic, without obvious abnormality, atraumatic Neck: no adenopathy, supple, symmetrical, trachea midline and thyroid not enlarged, symmetric, no tenderness/mass/nodules Back: symmetric, no curvature. ROM normal. No CVA tenderness. Lungs: clear to auscultation bilaterally Breasts: normal appearance, no masses or tenderness, on the right.  On the left she has a tenderness at 10:00 and at 3:00 positions.  No underlying mass and this could represent MS origin.  The area of previous concern at 7:00 position shtill has FCB changes and minimally tender.  Heart: regular rate and rhythm Abdomen: normal findings: no masses palpable, no organomegaly and soft, non-tender    Assessment:   ASSESSMENT:Patient is diagnosed with mastalgia, likely due to fibrocystic changes   Plan:   PLAN: The patient has a documented plan to follow with further care of diagnostic Mammo and [place] on 05/10/16 2.  PLAN: FOLLOW as needed

## 2016-05-06 NOTE — Progress Notes (Signed)
Patient scheduled for bilateral diagnostic mamm[facility] at 12:50 pm with 12:30 pm arrival. Patient is agreeable to date and time. Placed in mammogram recall.

## 2016-05-06 NOTE — Patient Instructions (Signed)
Will get Mammo changed to diagnostic.

## 2016-05-10 ENCOUNTER — Ambulatory Visit: Payer: 59

## 2016-05-10 ENCOUNTER — Ambulatory Visit
Admission: RE | Admit: 2016-05-10 | Discharge: 2016-05-10 | Disposition: A | Discharge status: Home / Self Care | Payer: 59 | Source: Ambulatory Visit | Attending: Nurse Practitioner | Admitting: Nurse Practitioner

## 2016-05-10 DIAGNOSIS — N644 Mastodynia: Secondary | ICD-10-CM

## 2016-05-10 NOTE — Progress Notes (Signed)
Encounter reviewed by Dr. Zandrea Kenealy Amundson C. Silva.  

## 2016-05-13 ENCOUNTER — Encounter: Payer: Self-pay | Admitting: Obstetrics and Gynecology

## 2016-05-27 ENCOUNTER — Ambulatory Visit: Payer: Self-pay | Admitting: Certified Nurse Midwife

## 2016-06-03 ENCOUNTER — Ambulatory Visit: Payer: Self-pay | Admitting: Certified Nurse Midwife

## 2016-06-03 NOTE — Progress Notes (Deleted)
56 y.o. G2P2 Married  {Race/ethnicity:17218} Fe here for annual exam.    Patient's last menstrual period was 11/14/2011 (approximate).          Sexually active: {yes no:314532}  The current method of family planning is {contraception:315051}.    Exercising: {yes no:314532}  {types:19826} Smoker:  {YES NO:22349}  Health Maintenance: Pap:  05-01-15 neg HPV HR neg MMG:  05-10-16 category c density birads 1:neg Colonoscopy:  9/16 BMD:   5/10 TDaP:  2013 Shingles: no Pneumonia: no Hep C and HIV: Hep c & HIV neg 2016 Labs:  Self breast exam:   reports that she has never smoked. She has never used smokeless tobacco. She reports that she drinks about 2.4 - 4.2 oz of alcohol per week . She reports that she does not use drugs.  Past Medical History:  Diagnosis Date  . Allergic rhinitis, cause unspecified   . Anemia   . Anxiety and depression    with previous medication use  . Asthma   . Rhinosinusitis     Past Surgical History:  Procedure Laterality Date  . BUNIONECTOMY     left  . CESAREAN SECTION     x2  . CHOLECYSTECTOMY    . HEMORRHOID BANDING    . left radius fracture    . TONSILLECTOMY    . TUBAL LIGATION      Current Outpatient Prescriptions  Medication Sig Dispense Refill  . BREO ELLIPTA 200-25 MCG/INH AEPB INHALE 1 PUFF INTO THE LUNGS DAILY. 180 each 2  . Cyanocobalamin (VITAMIN B-12) 2500 MCG SUBL Place 1 tablet under the tongue daily.    Marland Kitchen EPIPEN 2-PAK 0.3 MG/0.3ML SOAJ injection See admin instructions.  1  . fluticasone (FLONASE) 50 MCG/ACT nasal spray USE 2 SPRAYS IN EACH NOSTRIL TWICE A DAY 48 g 1  . ipratropium (ATROVENT) 0.02 % nebulizer solution Take 2.5 mLs (0.5 mg total) by nebulization every 6 (six) hours as needed for wheezing or shortness of breath. 62.5 mL 0  . levalbuterol (XOPENEX) 0.63 MG/3ML nebulizer solution 1 neb every 6 hours when needed 225 mL 3  . montelukast (SINGULAIR) 10 MG tablet TAKE 1 TABLET (10 MG TOTAL) BY MOUTH AT BEDTIME. 90 tablet  0  . PROAIR HFA 108 (90 Base) MCG/ACT inhaler USE 2 PUFFS EVERY 4 TO 6 HOURS AS NEEDED (RESCUE) 8.5 Inhaler 0   Current Facility-Administered Medications  Medication Dose Route Frequency Provider Last Rate Last Dose  . Mepolizumab SOLR 100 mg  100 mg Subcutaneous Q28 days Deneise Lever, MD        Family History  Problem Relation Age of Onset  . Emphysema Mother   . Cancer Mother     lung  . Hypertension Mother   . Heart disease Mother   . ALS Father   . Osteopenia Father   . Other Brother     born with one kidney  . Lupus Maternal Grandmother   . Hypertension Brother     2    ROS:  Pertinent items are noted in HPI.  Otherwise, a comprehensive ROS was negative.  Exam:   LMP 11/14/2011 (Approximate)    Ht Readings from Last 3 Encounters:  05/06/16 5\' 4"  (1.626 m)  04/21/16 5\' 4"  (1.626 m)  01/21/16 5\' 4"  (1.626 m)    General appearance: alert, cooperative and appears stated age Head: Normocephalic, without obvious abnormality, atraumatic Neck: no adenopathy, supple, symmetrical, trachea midline and thyroid {EXAM; THYROID:18604} Lungs: clear to auscultation bilaterally Breasts: {Exam; breast:13139::"normal  appearance, no masses or tenderness"} Heart: regular rate and rhythm Abdomen: soft, non-tender; no masses,  no organomegaly Extremities: extremities normal, atraumatic, no cyanosis or edema Skin: Skin color, texture, turgor normal. No rashes or lesions Lymph nodes: Cervical, supraclavicular, and axillary nodes normal. No abnormal inguinal nodes palpated Neurologic: Grossly normal   Pelvic: External genitalia:  no lesions              Urethra:  normal appearing urethra with no masses, tenderness or lesions              Bartholin's and Skene's: normal                 Vagina: normal appearing vagina with normal color and discharge, no lesions              Cervix: {exam; cervix:14595}              Pap taken: {yes no:314532} Bimanual Exam:  Uterus:  {exam;  uterus:12215}              Adnexa: {exam; adnexa:12223}               Rectovaginal: Confirms               Anus:  normal sphincter tone, no lesions  Chaperone present: ***  A:  Well Woman with normal exam  P:   Reviewed health and wellness pertinent to exam  Pap smear as above  {plan; gyn:5269::"mammogram","pap smear","return annually or prn"}  An After Visit Summary was printed and given to the patient.

## 2016-06-07 ENCOUNTER — Telehealth: Payer: Self-pay | Admitting: Internal Medicine

## 2016-06-07 NOTE — Telephone Encounter (Signed)
ATC pt-mailbox is full Will send in RX after verifying pharmacy. wcb

## 2016-06-07 NOTE — Telephone Encounter (Signed)
Offer augmentin 875, # 14, 1 twice daily  Stay well-hydrated

## 2016-06-07 NOTE — Telephone Encounter (Signed)
Spoke with pt, who states her URI has improved. Pt feels that she now developing a sinus infection. Pt c/o headache, head pressure, voice hoarseness, occ prod cough with yellow mucus X3d Pt taking tylenol cold & sudafed with mild relief. Pt is requesting recommendations.  CY please advise. Thanks.   Current Outpatient Prescriptions on File Prior to Visit  Medication Sig Dispense Refill  . BREO ELLIPTA 200-25 MCG/INH AEPB INHALE 1 PUFF INTO THE LUNGS DAILY. 180 each 2  . Cyanocobalamin (VITAMIN B-12) 2500 MCG SUBL Place 1 tablet under the tongue daily.    Marland Kitchen EPIPEN 2-PAK 0.3 MG/0.3ML SOAJ injection See admin instructions.  1  . fluticasone (FLONASE) 50 MCG/ACT nasal spray USE 2 SPRAYS IN EACH NOSTRIL TWICE A DAY 48 g 1  . ipratropium (ATROVENT) 0.02 % nebulizer solution Take 2.5 mLs (0.5 mg total) by nebulization every 6 (six) hours as needed for wheezing or shortness of breath. 62.5 mL 0  . levalbuterol (XOPENEX) 0.63 MG/3ML nebulizer solution 1 neb every 6 hours when needed 225 mL 3  . montelukast (SINGULAIR) 10 MG tablet TAKE 1 TABLET (10 MG TOTAL) BY MOUTH AT BEDTIME. 90 tablet 0  . PROAIR HFA 108 (90 Base) MCG/ACT inhaler USE 2 PUFFS EVERY 4 TO 6 HOURS AS NEEDED (RESCUE) 8.5 Inhaler 0   Current Facility-Administered Medications on File Prior to Visit  Medication Dose Route Frequency Provider Last Rate Last Dose  . Mepolizumab SOLR 100 mg  100 mg Subcutaneous Q28 days Deneise Lever, MD        Allergies  Allergen Reactions  . Doxycycline Anxiety  . Levofloxacin     REACTION: severe-hives,edema,throat closed up  . Pneumococcal Vaccines     Per MD-never take again; red area under and around arm

## 2016-06-08 NOTE — Telephone Encounter (Signed)
ATC pt--mailbox is full.  Will try back later.

## 2016-06-09 MED ORDER — AMOXICILLIN-POT CLAVULANATE 875-125 MG PO TABS: 1.0000 | ORAL_TABLET | Freq: Two times a day (BID) | ORAL | 0 refills | Status: DC

## 2016-06-09 NOTE — Telephone Encounter (Signed)
Called and spoke with pt and she is aware of augmentin that has been sent to her pharmacy. Nothing further is needed.

## 2016-07-10 ENCOUNTER — Other Ambulatory Visit: Payer: Self-pay | Admitting: Internal Medicine

## 2016-07-18 ENCOUNTER — Other Ambulatory Visit: Payer: Self-pay | Admitting: Internal Medicine

## 2016-07-22 ENCOUNTER — Telehealth: Payer: Self-pay | Admitting: *Deleted

## 2016-07-22 NOTE — Telephone Encounter (Signed)
PT WOULD LIKE TO KNOW IF SHE CAN SET UP A PAYMENT PLAN FOR HER SONS BILLS. WOULD LIKE A RETURN CALL

## 2016-07-22 NOTE — Telephone Encounter (Signed)
Will pay $50/mo - made pmt today

## 2016-07-28 ENCOUNTER — Ambulatory Visit: Payer: Self-pay | Admitting: Obstetrics & Gynecology

## 2016-08-15 ENCOUNTER — Telehealth: Payer: Self-pay | Admitting: Internal Medicine

## 2016-08-15 NOTE — Telephone Encounter (Signed)
ATC, NA and VM not set up yet

## 2016-08-15 NOTE — Telephone Encounter (Signed)
Called and spoke to pt. Pt states she had a URI in late Jan 2018 and was prescribed Augmentin. She states it took her 6 weeks to "get over it" but is still having wheezing and prod cough with clear to green mucus. Pt states she is using her inhalers, singulair and allegra as prescribed. Pt is requesting recs from [facility].   Dr. Annamaria Boots please advise. Thanks.   Allergies  Allergen Reactions  . Doxycycline Anxiety  . Levofloxacin     REACTION: severe-hives,edema,throat closed up  . Pneumococcal Vaccines     Per MD-never take again; red area under and around arm    Current Outpatient Prescriptions on File Prior to Visit  Medication Sig Dispense Refill  . BREO ELLIPTA 200-25 MCG/INH AEPB INHALE 1 PUFF INTO THE LUNGS DAILY. 180 each 2  . Cyanocobalamin (VITAMIN B-12) 2500 MCG SUBL Place 1 tablet under the tongue daily.    Marland Kitchen EPIPEN 2-PAK 0.3 MG/0.3ML SOAJ injection See admin instructions.  1  . fluticasone (FLONASE) 50 MCG/ACT nasal spray USE 2 SPRAYS IN EACH NOSTRIL TWICE A DAY 48 g 1  . ipratropium (ATROVENT) 0.02 % nebulizer solution Take 2.5 mLs (0.5 mg total) by nebulization every 6 (six) hours as needed for wheezing or shortness of breath. 62.5 mL 0  . levalbuterol (XOPENEX) 0.63 MG/3ML nebulizer solution 1 neb every 6 hours when needed 225 mL 3  . montelukast (SINGULAIR) 10 MG tablet TAKE 1 TABLET (10 MG TOTAL) BY MOUTH AT BEDTIME. 90 tablet 1  . PROAIR HFA 108 (90 Base) MCG/ACT inhaler USE 2 PUFFS EVERY 4 TO 6 HOURS AS NEEDED (RESCUE) 8.5 Inhaler 0   Current Facility-Administered Medications on File Prior to Visit  Medication Dose Route Frequency Provider Last Rate Last Dose  . Mepolizumab SOLR 100 mg  100 mg Subcutaneous Q28 days Deneise Lever, MD

## 2016-08-15 NOTE — Telephone Encounter (Signed)
I would like to let her start Nucala as a long term strategy.  For immediate issue, please start prednisone 10 mg # 20, 1 daily x 1 week, then one every other day.

## 2016-08-16 MED ORDER — PREDNISONE 10 MG PO TABS: ORAL_TABLET | ORAL | 0 refills | Status: DC

## 2016-08-16 NOTE — Telephone Encounter (Signed)
Spoke with the pt and notified of recs per CDY  She verbalized understanding  Rx for pred was sent  Will forward to [place] to start on [place]

## 2016-08-17 NOTE — Telephone Encounter (Signed)
Tammy Scott handles the spec meds now; will forward to her to start process again. Pt had signed PAN form in past-need to make sure [facility] has it on file.

## 2016-08-19 ENCOUNTER — Ambulatory Visit (INDEPENDENT_AMBULATORY_CARE_PROVIDER_SITE_OTHER): Payer: 59 | Admitting: Certified Nurse Midwife

## 2016-08-19 ENCOUNTER — Encounter: Payer: Self-pay | Admitting: Certified Nurse Midwife

## 2016-08-19 VITALS — BP 104/74 | HR 64 | Resp 16 | Ht 62.75 in | Wt 202.0 lb

## 2016-08-19 DIAGNOSIS — Z0000 Encounter for general adult medical examination without abnormal findings: Secondary | ICD-10-CM | POA: Diagnosis not present

## 2016-08-19 DIAGNOSIS — Z01419 Encounter for gynecological examination (general) (routine) without abnormal findings: Secondary | ICD-10-CM | POA: Diagnosis not present

## 2016-08-19 DIAGNOSIS — Z124 Encounter for screening for malignant neoplasm of cervix: Secondary | ICD-10-CM

## 2016-08-19 LAB — CBC
HCT: 44.5 % (ref 35.0–45.0)
Hemoglobin: 14.7 g/dL (ref 11.7–15.5)
MCH: 30.3 pg (ref 27.0–33.0)
MCHC: 33 g/dL (ref 32.0–36.0)
MCV: 91.8 fL (ref 80.0–100.0)
MPV: 10.4 fL (ref 7.5–12.5)
PLATELETS: 307 10*3/uL (ref 140–400)
RBC: 4.85 MIL/uL (ref 3.80–5.10)
RDW: 14 % (ref 11.0–15.0)
WBC: 7.6 10*3/uL (ref 3.8–10.8)

## 2016-08-19 LAB — LIPID PANEL
CHOL/HDL RATIO: 3 ratio (ref <5.0)
Cholesterol: 238 mg/dL — ABNORMAL HIGH (ref <200)
HDL: 79 mg/dL (ref >50)
LDL CALC: 141 mg/dL — AB (ref <100)
Triglycerides: 91 mg/dL (ref <150)
VLDL: 18 mg/dL (ref <30)

## 2016-08-19 LAB — COMPREHENSIVE METABOLIC PANEL
ALK PHOS: 97 U/L (ref 33–130)
ALT: 23 U/L (ref 6–29)
AST: 26 U/L (ref 10–35)
Albumin: 4 g/dL (ref 3.6–5.1)
BILIRUBIN TOTAL: 0.4 mg/dL (ref 0.2–1.2)
BUN: 9 mg/dL (ref 7–25)
CO2: 25 mmol/L (ref 20–31)
Calcium: 9.4 mg/dL (ref 8.6–10.4)
Chloride: 107 mmol/L (ref 98–110)
Creat: 0.77 mg/dL (ref 0.50–1.05)
GLUCOSE: 94 mg/dL (ref 65–99)
Potassium: 4.6 mmol/L (ref 3.5–5.3)
SODIUM: 142 mmol/L (ref 135–146)
Total Protein: 6.8 g/dL (ref 6.1–8.1)

## 2016-08-19 LAB — TSH: TSH: 1 mIU/L

## 2016-08-19 NOTE — Patient Instructions (Signed)

## 2016-08-19 NOTE — Progress Notes (Signed)
55 y.o. G2P2 Married  Caucasian [name] here for annual exam. Menopausal no symptoms now. Denies vaginal bleeding. Some vaginal dryness, using OTC lubricant with some relief. Recent visit with FNP for asthma/allergies on steroid now. No health issues today. Screening labs desired.  Patient's last menstrual period was 11/14/2011 (approximate).          Sexually active: Yes.    The current method of family planning is tubal ligation.    Exercising: No.  exercise Smoker:  no  Health Maintenance: Pap:  02-09-12 neg HPV HR neg, 05-01-15 neg HPV HR neg MMG:  12/17 bilateral & left breast u/s category c density birads 1:neg Colonoscopy:  9/16 BMD:   5/11 TDaP:  2013 Shingles: n/a Pneumonia: 2012 allergric Hep C and HIV: both neg 2016 Labs:  Self breast exam: pt checks them   reports that she has never smoked. She has never used smokeless tobacco. She reports that she drinks about 2.4 - 4.2 oz of alcohol per week . She reports that she does not use drugs.  Past Medical History:  Diagnosis Date  . Allergic rhinitis, cause unspecified   . Anemia   . Anxiety and depression    with previous medication use  . Asthma   . Rhinosinusitis     Past Surgical History:  Procedure Laterality Date  . BUNIONECTOMY     left  . CESAREAN SECTION     x2  . CHOLECYSTECTOMY    . HEMORRHOID BANDING    . left radius fracture    . TONSILLECTOMY    . TUBAL LIGATION      Current Outpatient Prescriptions  Medication Sig Dispense Refill  . BREO ELLIPTA 200-25 MCG/INH AEPB INHALE 1 PUFF INTO THE LUNGS DAILY. 180 each 2  . Cyanocobalamin (VITAMIN B-12) 2500 MCG SUBL Place 1 tablet under the tongue daily.    Marland Kitchen EPIPEN 2-PAK 0.3 MG/0.3ML SOAJ injection See admin instructions.  1  . fluticasone (FLONASE) 50 MCG/ACT nasal spray USE 2 SPRAYS IN EACH NOSTRIL TWICE A DAY 48 g 1  . ipratropium (ATROVENT) 0.02 % nebulizer solution Take 2.5 mLs (0.5 mg total) by nebulization every 6 (six) hours as needed for wheezing or  shortness of breath. 62.5 mL 0  . levalbuterol (XOPENEX) 0.63 MG/3ML nebulizer solution 1 neb every 6 hours when needed 225 mL 3  . montelukast (SINGULAIR) 10 MG tablet TAKE 1 TABLET (10 MG TOTAL) BY MOUTH AT BEDTIME. 90 tablet 1  . predniSONE (DELTASONE) 10 MG tablet 1 daily x 1 wk, then 1 every other day 20 tablet 0  . PROAIR HFA 108 (90 Base) MCG/ACT inhaler USE 2 PUFFS EVERY 4 TO 6 HOURS AS NEEDED (RESCUE) 8.5 Inhaler 0   Current Facility-Administered Medications  Medication Dose Route Frequency Provider Last Rate Last Dose  . Mepolizumab SOLR 100 mg  100 mg Subcutaneous Q28 days Deneise Lever, MD        Family History  Problem Relation Age of Onset  . Emphysema Mother   . Cancer Mother     lung  . Hypertension Mother   . Heart disease Mother   . ALS Father   . Osteopenia Father   . Other Brother     born with one kidney  . Lupus Maternal Grandmother   . Hypertension Brother     2    ROS:  Pertinent items are noted in HPI.  Otherwise, a comprehensive ROS was negative.  Exam:   LMP 11/14/2011 (Approximate)  Ht Readings from Last 3 Encounters:  05/06/16 5\' 4"  (1.626 m)  04/21/16 5\' 4"  (1.626 m)  01/21/16 5\' 4"  (1.626 m)    General appearance: alert, cooperative and appears stated age Head: Normocephalic, without obvious abnormality, atraumatic Neck: no adenopathy, supple, symmetrical, trachea midline and thyroid normal to inspection and palpation Lungs: clear to auscultation bilaterally Breasts: normal appearance, no masses or tenderness, No nipple retraction or dimpling, No nipple discharge or bleeding, No axillary or supraclavicular adenopathy Heart: regular rate and rhythm Abdomen: soft, non-tender; no masses,  no organomegaly Extremities: extremities normal, atraumatic, no cyanosis or edema Skin: Skin color, texture, turgor normal. No rashes or lesions Lymph nodes: Cervical, supraclavicular, and axillary nodes normal. No abnormal inguinal nodes  palpated Neurologic: Grossly normal   Pelvic: External genitalia:  no lesions              Urethra:  normal appearing urethra with no masses, tenderness or lesions              Bartholin's and Skene's: normal                 Vagina: normal appearing vagina with normal color and scant moisture or discharge, no lesions              Cervix: multiparous appearance, no bleeding following Pap, no cervical motion tenderness and no lesions              Pap taken: Yes.   Bimanual Exam:  Uterus:  normal size, contour, position, consistency, mobility, non-tender              Adnexa: normal adnexa and no mass, fullness, tenderness               Rectovaginal: Confirms               Anus:  normal sphincter tone, no lesions  Chaperone present: yes  A:  Well Woman with normal exam  Menopausal no HRT  Vaginal dryness  Asthma/allergies with PCP management  Screening labs  P:   Reviewed health and wellness pertinent to exam  Aware of need to evaluate if vaginal bleeding  Discussed OTC options for vaginal dryness with instructions. Patient plans to try coconut oil and will advise if issues.  Labs: CMP,Lipid panel, CBC,TSH,Vitamin D  Pap smear as above    counseled on breast self exam, mammography screening, menopause, adequate intake of calcium and vitamin D, diet and exercise  return annually or prn  An After Visit Summary was printed and given to the patient.

## 2016-08-20 LAB — VITAMIN D 25 HYDROXY (VIT D DEFICIENCY, FRACTURES): Vit D, 25-Hydroxy: 22 ng/mL — ABNORMAL LOW (ref 30–100)

## 2016-08-22 LAB — IPS PAP TEST WITH REFLEX TO HPV

## 2016-08-23 ENCOUNTER — Other Ambulatory Visit: Payer: Self-pay | Admitting: Certified Nurse Midwife

## 2016-08-23 DIAGNOSIS — R899 Unspecified abnormal finding in specimens from other organs, systems and tissues: Secondary | ICD-10-CM

## 2016-08-24 NOTE — Progress Notes (Signed)
Encounter reviewed Dessie Tatem, MD   

## 2016-08-25 NOTE — Telephone Encounter (Signed)
Resubmitted Nucala filled out by [facility] 8/17 and signed by pt..(08/24/16) I received confirmation from [facility] to nucala this morning. They said it would take up to 6 business days. I'll give them til day 4 or 5 and call to see where their at with this.

## 2016-08-29 NOTE — Telephone Encounter (Signed)
Rep. Called from [facility] . To Nucala wanting to know what to do with the info. I sent. I'm sure I marked the paper or put a comment on a cover sheet, to resubmit request pt. Never started nucala. Pt. Was  approved thru Aug. Of this yr..( I should have asked them to please run the benefits again.) She assured me I'd hear something by tomorrow. Keeping encounter open until I hear something.

## 2016-08-31 NOTE — Telephone Encounter (Signed)
I called [facility] b/c I hadn't heard from them. Also our phones and fax have been down. Pt, has been approved:P/A #: [id number] Dates: 12/30/15-12/29/16. Waiting for pharm. To get permiss. From pt. To ship nucala once they have that they will call us and set up shipment. Will leave encounter open.

## 2016-09-01 NOTE — Telephone Encounter (Signed)
Called pt. To see if she had heard from [facility]Marland Kitchen Lm with vm.. [facility] said [facility], the rep transferred me to the wrong pharmacist. She read the rx off to me for verification.( they did have it)  This pharmacist said they would have to get in touch with the pt.. Then hopefully set up delivery. Waiting for [facility] to call.

## 2016-09-05 NOTE — Telephone Encounter (Signed)
Called pharmacy they haven't been able to reach the pt. Neither have I. I called and left another message on her mobile voice mail and gave her [facility] to call so she can give them the info. They need. Then the pharm. Can call us and set up shipment.

## 2016-09-12 NOTE — Telephone Encounter (Signed)
I was able to get in touch with the pt. Finally. She saw [facility] in 04/2016 and they discussed since she started  Allergy shots in Oct. With [facility], pt. Decided NOT to start Nucala.  [facility] noted in chart, Will keep "Biologics" like Nucala in mind in the future, but not needed now. Nothing further needed. Closing.

## 2016-09-12 NOTE — Telephone Encounter (Signed)
Tammy please advise if this message can be closed.  Thanks!

## 2016-10-04 ENCOUNTER — Telehealth: Payer: Self-pay | Admitting: Internal Medicine

## 2016-10-04 NOTE — Telephone Encounter (Signed)
Attempted to contact the pt to verify which test she would like to sent to [facility].  VM is full.  WCB.

## 2016-10-04 NOTE — Telephone Encounter (Signed)
Correction that is meg whelan @ [facility] please advise.Amy Rollins

## 2016-10-04 NOTE — Telephone Encounter (Signed)
She is wanting CBC results and something that has to be measures before starting on nucula (enfinophil) she can be reached @ [phone] .Amy Rollins

## 2016-10-04 NOTE — Telephone Encounter (Signed)
Pt is requesting lab results be sent to Dr Tiajuana Amass -- CBC/Eeosinophils Pt also needs approval of Nucala transferred to Dr Orvil Feil. -- this has been faxed (electronically) with labs results.  Nothing further needed.

## 2016-10-05 ENCOUNTER — Other Ambulatory Visit: Payer: Self-pay | Admitting: Internal Medicine

## 2016-10-06 NOTE — Telephone Encounter (Signed)
[facility] Please advise on refill. Thanks.  

## 2016-10-06 NOTE — Telephone Encounter (Signed)
Ok # 30, refill x 3

## 2016-11-29 ENCOUNTER — Other Ambulatory Visit: Payer: Self-pay | Admitting: Internal Medicine

## 2017-02-22 ENCOUNTER — Telehealth: Payer: Self-pay | Admitting: Certified Nurse Midwife

## 2017-02-22 ENCOUNTER — Other Ambulatory Visit: Payer: 59

## 2017-02-22 NOTE — Telephone Encounter (Signed)
Called patient regarding missed lab appointment today. Mailbox is full.

## 2017-02-22 NOTE — Telephone Encounter (Signed)
Appointment rescheduled to 04/03/17.

## 2017-03-15 ENCOUNTER — Ambulatory Visit (INDEPENDENT_AMBULATORY_CARE_PROVIDER_SITE_OTHER): Payer: 59

## 2017-03-15 DIAGNOSIS — Z23 Encounter for immunization: Secondary | ICD-10-CM

## 2017-03-17 ENCOUNTER — Telehealth: Payer: Self-pay | Admitting: Internal Medicine

## 2017-03-17 NOTE — Telephone Encounter (Signed)
ATC. VM was full. Will attempt to call back later.

## 2017-03-17 NOTE — Telephone Encounter (Signed)
Spoke with patient. She was concerned about a localized reaction to the flu vaccine she received earlier this week. Red area was located on her left deltoid. She denied any SOB or other symptoms. Advised patient that the redness and soreness was normal and to keep an eye out of other symptoms.   She verbalized understanding. Nothing else needed.

## 2017-03-17 NOTE — Telephone Encounter (Signed)
Patient came into the office today. Please see other message from 03/17/17

## 2017-04-03 ENCOUNTER — Telehealth: Payer: Self-pay | Admitting: Certified Nurse Midwife

## 2017-04-03 ENCOUNTER — Other Ambulatory Visit: Payer: Self-pay

## 2017-04-03 NOTE — Telephone Encounter (Signed)
Called patient about missed lab appointment. No answer and mailbox is full.

## 2017-04-10 NOTE — Telephone Encounter (Signed)
Left message on voicemail to reschedule missed lab appointment. Ok to close encounter?

## 2017-04-11 NOTE — Telephone Encounter (Signed)
She is aware of need to have rechecked OK to close

## 2017-06-22 ENCOUNTER — Ambulatory Visit
Admission: RE | Admit: 2017-06-22 | Discharge: 2017-06-22 | Disposition: A | Discharge status: Home / Self Care | Payer: 59 | Source: Ambulatory Visit | Attending: Certified Nurse Midwife | Admitting: Certified Nurse Midwife

## 2017-06-22 ENCOUNTER — Other Ambulatory Visit: Payer: Self-pay | Admitting: Certified Nurse Midwife

## 2017-06-22 DIAGNOSIS — Z1231 Encounter for screening mammogram for malignant neoplasm of breast: Secondary | ICD-10-CM

## 2017-07-06 ENCOUNTER — Ambulatory Visit (INDEPENDENT_AMBULATORY_CARE_PROVIDER_SITE_OTHER): Payer: 59 | Admitting: Internal Medicine

## 2017-07-06 ENCOUNTER — Encounter: Payer: Self-pay | Admitting: Internal Medicine

## 2017-07-06 DIAGNOSIS — J302 Other seasonal allergic rhinitis: Secondary | ICD-10-CM | POA: Diagnosis not present

## 2017-07-06 DIAGNOSIS — J3089 Other allergic rhinitis: Secondary | ICD-10-CM

## 2017-07-06 DIAGNOSIS — J4540 Moderate persistent asthma, uncomplicated: Secondary | ICD-10-CM

## 2017-07-06 NOTE — Patient Instructions (Signed)
For the heartburn, try otc Pepcid, one daily before breakfast. Try this for a couple of weeks to see if you notice a difference.  Please call if we can help

## 2017-07-06 NOTE — Progress Notes (Signed)
Patient ID: Amy Rollins, female    DOB: March 12, 1961, 57 y.o.   MRN: [id number]  HPI F never smoker followed for asthma, allergic rhinitis PFT: 01/28/2010 within normal limits with insignificant response to bronchodilator. Mild decrease in diffusion capacity. FVC 2.70/81%, FEV1 2.22/87%, FEV1/FEC 0.82. TLC 90%, DLCO 75%. CT max fac 10/20/14- IMPRESSION: Diffuse sinus disease changes as above Allergy profile 10/20/14- Total IgE 263, elevated grass pollens CBC- 10/20/14  EOS absolute 0.8 (H) Office Spirometry 12/21/2015-moderate restrictive and obstructive changes with reduced exhaled volume. FVC 1.73/51%, FEV1 1.33/50%, ratio 77%, FEF 25-75 1.10/43% FENO 04/21/16- 28, she reports score 15 at her allergy office. --------------------------------- 04/21/2016-57 year old female never smoker followed for Asthma, allergic rhinitis, chronic rhinosinusitis FOLLOWS FOR: Pt states she is doing well;slight cough and started allergy shots through [facility]([place]).  Pt did decide NOT to start Nucala. She feels well today. Mentions residual decreased sense of smell and minor nasal stuffiness. Very occasional mild heartburn. We discussed known chronic rhinosinusitis and reflux as potential aggravators of asthma FENO 04/21/16- 28, she reports score 15 at her allergy office.  07/06/17- 57 year old female never smoker followed for Asthma/ (Nucala since 01/21/16), allergic rhinitis, chronic rhinosinusitis  ---59yr f/u for asthma. States asthma has been doing well since starting Nucala injections  She credits Nucala, being given now through allergy at [facility], for substantial improvement in her asthma.  Occasional wheeze and need for rescue inhaler but very much improved.  Much more confident going outside for walks now. She says her allergist wanted her to main contact with [place] for pulmonary care, but she really is not needing active pulmonary care separate from her asthma/allergy  management. Incidental heartburn.  I recommended Pepcid and asked her to speak to her PCP about this.  Review of Systems-see HPI + = positive Constitutional:   No-   weight loss, night sweats, fevers, chills, fatigue, lassitude. HEENT:    headaches, No difficulty swallowing, tooth/dental problems,  sore throat,       No-  sneezing, itching, ear ache, +nasal congestion, post nasal drip,  CV:        chest pain, orthopnea, PND, swelling in lower extremities, anasarca, dizziness, palpitations Resp: No-   shortness of breath with exertion or at rest.             No- productive cough,  No non-productive cough,  No- coughing up of blood.              No-change in color of mucus.  + wheezing.   Skin: No-   rash or lesions. GI:  No-   heartburn, indigestion, abdominal pain, nausea, vomiting, GU:  MS:  No-   joint pain or swelling.  Neuro-     nothing unusual Psych:  No- change in mood or affect. No depression or anxiety.  No memory loss.  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            Eyes- Gross vision intact, PERRLA, conjunctivae clear secretions            Ears- Hearing, canals-normal            Nose-  sniffing, + turbinate edema, no-Septal dev,  polyps, erosion, perforation             Throat- Mallampati III , mucosa clear , drainage- none,  tonsils- atrophic Neck- flexible , trachea midline, no stridor , thyroid nl, carotid no bruit Chest - symmetrical excursion , unlabored           Heart/CV- RRR , no murmur , no gallop  , no rub, nl s1 s2                           - JVD- none , edema- none, stasis changes- none, varices- none           Lung- clear, unlabored, cough-none , dullness-none, rub- none           Chest wall-  Abd-  Br/ Gen/ Rectal- Not done, not indicated Extrem- cyanosis- none,  clubbing, none, atrophy- none, strength- nl Neuro- grossly intact to observation

## 2017-07-07 NOTE — Assessment & Plan Note (Signed)
Nucala associated with significant improvement in asthma management.  Rare need for rescue inhaler now and no significant exacerbations.  No ER trips.  She can be followed by her allergist.  We will be happy to see her here again if our help is needed.

## 2017-07-07 NOTE — Assessment & Plan Note (Signed)
She questions whether allergy vaccine is needed any longer. She will discuss with her allergist.

## 2017-08-03 ENCOUNTER — Telehealth: Payer: Self-pay | Admitting: Internal Medicine

## 2017-08-03 NOTE — Telephone Encounter (Signed)
ATC pt- unable to leave vm due to mailbox being full.  

## 2017-08-04 NOTE — Telephone Encounter (Signed)
Spoke with pt, states that on her allergy list it states that Doxycycline causes anxiety- this is incorrect, causes hives and that she's tolerated this well in the past.  I've updated chart to reflect correctly.  Nothing further needed.

## 2017-10-24 ENCOUNTER — Ambulatory Visit: Payer: Self-pay | Admitting: Certified Nurse Midwife

## 2017-10-25 ENCOUNTER — Encounter: Payer: Self-pay | Admitting: Certified Nurse Midwife

## 2017-10-25 ENCOUNTER — Ambulatory Visit (INDEPENDENT_AMBULATORY_CARE_PROVIDER_SITE_OTHER): Payer: 59 | Admitting: Certified Nurse Midwife

## 2017-10-25 ENCOUNTER — Other Ambulatory Visit: Payer: Self-pay

## 2017-10-25 VITALS — BP 102/76 | HR 68 | Resp 16 | Ht 62.75 in | Wt 212.0 lb

## 2017-10-25 DIAGNOSIS — Z780 Asymptomatic menopausal state: Secondary | ICD-10-CM

## 2017-10-25 DIAGNOSIS — Z01419 Encounter for gynecological examination (general) (routine) without abnormal findings: Secondary | ICD-10-CM

## 2017-10-25 DIAGNOSIS — Z0000 Encounter for general adult medical examination without abnormal findings: Secondary | ICD-10-CM | POA: Diagnosis not present

## 2017-10-25 DIAGNOSIS — E663 Overweight: Secondary | ICD-10-CM

## 2017-10-25 DIAGNOSIS — Z1211 Encounter for screening for malignant neoplasm of colon: Secondary | ICD-10-CM

## 2017-10-25 NOTE — Patient Instructions (Signed)

## 2017-10-25 NOTE — Progress Notes (Signed)
57 y.o. G2P2 Married  Caucasian Fe here for annual exam. Menopausal no HRT. Denies vaginal bleeding. Vaginal dryness still occurring, but declines estrogen use and is not consistent with OTC methods. Aware she has gained weight, 10 pounds in the past year. Patient feels she is drinking too much coke daily, with the sugar content. Aware she needs to work on weight loss for better health. Asthma management with Pulmonary, stable at present. Sees Dr. Earlean Shawl for hemorrhoid management. Social stress with teenage son vaping. No other health issues today. Desires screening labs today.   Patient's last menstrual period was 11/14/2011 (approximate).       Sexually active: Yes.    The current method of family planning is tubal ligation.   Exercising: Yes.    walking Smoker:  no  Health Maintenance: Pap:  05-01-15 neg HPV HR neg, 08-19-16 neg History of Abnormal Pap: no MMG:  06-22-17 category c density birads 1:neg Self Breast exams: yes Colonoscopy:  9/16 BMD:   2011 TDaP:  2013 Shingles: not done Pneumonia: 2012 Hep C and HIV: both neg 2016 Labs: yes   reports that she has never smoked. She has never used smokeless tobacco. She reports that she drinks alcohol. She reports that she does not use drugs.  Past Medical History:  Diagnosis Date  . Allergic rhinitis, cause unspecified   . Anemia   . Anxiety and depression    with previous medication use  . Asthma   . Rhinosinusitis     Past Surgical History:  Procedure Laterality Date  . BUNIONECTOMY     left  . CESAREAN SECTION     x2  . CHOLECYSTECTOMY    . HEMORRHOID BANDING    . left radius fracture    . TONSILLECTOMY    . TUBAL LIGATION      Current Outpatient Medications  Medication Sig Dispense Refill  . amphetamine-dextroamphetamine (ADDERALL XR) 15 MG 24 hr capsule Take by mouth daily.  0  . BREO ELLIPTA 200-25 MCG/INH AEPB INHALE 1 PUFF INTO THE LUNGS DAILY. 180 each 2  . Cyanocobalamin (VITAMIN B-12) 2500 MCG SUBL Place 1  tablet under the tongue as needed.     . fluticasone (FLONASE) 50 MCG/ACT nasal spray USE 2 SPRAYS IN EACH NOSTRIL TWICE A DAY (Patient taking differently: USE 2 SPRAYS IN EACH NOSTRIL AS NEEDED) 48 g 1  . ipratropium (ATROVENT) 0.02 % nebulizer solution Take 2.5 mLs (0.5 mg total) by nebulization every 6 (six) hours as needed for wheezing or shortness of breath. 62.5 mL 0  . levalbuterol (XOPENEX) 0.63 MG/3ML nebulizer solution 1 neb every 6 hours when needed 225 mL 3  . montelukast (SINGULAIR) 10 MG tablet TAKE 1 TABLET (10 MG TOTAL) BY MOUTH AT BEDTIME. 90 tablet 1  . PROAIR HFA 108 (90 Base) MCG/ACT inhaler USE 2 PUFFS EVERY 4 TO 6 HOURS AS NEEDED (RESCUE) 8.5 Inhaler 0  . UNABLE TO FIND Allergy shots weekly    . EPIPEN 2-PAK 0.3 MG/0.3ML SOAJ injection See admin instructions.  1   Current Facility-Administered Medications  Medication Dose Route Frequency Provider Last Rate Last Dose  . Mepolizumab SOLR 100 mg  100 mg Subcutaneous Q28 days Deneise Lever, MD        Family History  Problem Relation Age of Onset  . Emphysema Mother   . Cancer Mother        lung  . Hypertension Mother   . Heart disease Mother   . ALS Father   .  Osteopenia Father   . Other Brother        born with one kidney  . Lupus Maternal Grandmother   . Hypertension Brother        2  . Breast cancer Maternal Aunt     ROS:  Pertinent items are noted in HPI.  Otherwise, a comprehensive ROS was negative.  Exam:   BP 102/76   Pulse 68   Resp 16   Ht 5' 2.75" (1.594 m)   Wt 212 lb (96.2 kg)   LMP 11/14/2011 (Approximate)   BMI 37.85 kg/m  Height: 5' 2.75" (159.4 cm) Ht Readings from Last 3 Encounters:  10/25/17 5' 2.75" (1.594 m)  07/06/17 5' 2.75" (1.594 m)  08/19/16 5' 2.75" (1.594 m)    General appearance: alert, cooperative and appears stated age Head: Normocephalic, without obvious abnormality, atraumatic Neck: no adenopathy, supple, symmetrical, trachea midline and thyroid normal to  inspection and palpation Lungs: clear to auscultation bilaterally Breasts: normal appearance, no masses or tenderness, No nipple retraction or dimpling, No nipple discharge or bleeding, No axillary or supraclavicular adenopathy Heart: regular rate and rhythm Abdomen: soft, non-tender; no masses,  no organomegaly Extremities: extremities normal, atraumatic, no cyanosis or edema Skin: Skin color, texture, turgor normal. No rashes or lesions Lymph nodes: Cervical, supraclavicular, and axillary nodes normal. No abnormal inguinal nodes palpated Neurologic: Grossly normal   Pelvic: External genitalia:  no lesions              Urethra:  normal appearing urethra with no masses, tenderness or lesions              Bartholin's and Skene's: normal                 Vagina: normal appearing vagina with normal color and discharge, no lesions              Cervix: anteverted, multiparous appearance, no cervical motion tenderness and no lesions              Pap taken: Yes.   Bimanual Exam:  Uterus:  normal size, contour, position, consistency, mobility, non-tender and retroverted              Adnexa: normal adnexa and no mass, fullness, tenderness               Rectovaginal: Confirms               Anus:  normal sphincter tone, no lesions  Chaperone present: yes  A:  Well Woman with normal exam  Contraception tubal ligation  Menopausal no HRT  Overweight  Asthma with pulmonary management  Screening labs    P:   Reviewed health and wellness pertinent to exam  Aware of need to evaluate if vaginal bleeding  Discussed regular exercise, portion control and working on good fresh food choices and lean protein.  Continue follow up with MD as indicated  IFOB dispensed, colonoscopy due next year  Labs: TSH, CBC, CMP, Lipid panel, Vitamin D  Pap smear: no   counseled on breast self exam, mammography screening, feminine hygiene, adequate intake of calcium and vitamin D, diet and exercise  return annually  or prn  An After Visit Summary was printed and given to the patient.

## 2017-10-26 ENCOUNTER — Other Ambulatory Visit (INDEPENDENT_AMBULATORY_CARE_PROVIDER_SITE_OTHER): Payer: 59

## 2017-10-26 ENCOUNTER — Other Ambulatory Visit: Payer: 59

## 2017-10-26 DIAGNOSIS — E663 Overweight: Secondary | ICD-10-CM

## 2017-10-26 DIAGNOSIS — Z0000 Encounter for general adult medical examination without abnormal findings: Secondary | ICD-10-CM

## 2017-10-26 DIAGNOSIS — Z780 Asymptomatic menopausal state: Secondary | ICD-10-CM

## 2017-10-27 ENCOUNTER — Other Ambulatory Visit: Payer: 59

## 2017-10-27 LAB — COMPREHENSIVE METABOLIC PANEL
ALBUMIN: 4.1 g/dL (ref 3.5–5.5)
ALK PHOS: 93 IU/L (ref 39–117)
ALT: 23 IU/L (ref 0–32)
AST: 24 IU/L (ref 0–40)
Albumin/Globulin Ratio: 1.8 (ref 1.2–2.2)
BILIRUBIN TOTAL: 0.4 mg/dL (ref 0.0–1.2)
BUN / CREAT RATIO: 12 (ref 9–23)
BUN: 10 mg/dL (ref 6–24)
CO2: 21 mmol/L (ref 20–29)
Calcium: 9.3 mg/dL (ref 8.7–10.2)
Chloride: 107 mmol/L — ABNORMAL HIGH (ref 96–106)
Creatinine, Ser: 0.85 mg/dL (ref 0.57–1.00)
GFR calc Af Amer: 89 mL/min/{1.73_m2} (ref >59)
GFR calc non Af Amer: 77 mL/min/{1.73_m2} (ref >59)
GLUCOSE: 93 mg/dL (ref 65–99)
Globulin, Total: 2.3 g/dL (ref 1.5–4.5)
Potassium: 4.2 mmol/L (ref 3.5–5.2)
SODIUM: 142 mmol/L (ref 134–144)
Total Protein: 6.4 g/dL (ref 6.0–8.5)

## 2017-10-27 LAB — TSH: TSH: 1.66 u[IU]/mL (ref 0.450–4.500)

## 2017-10-27 LAB — CBC
HEMOGLOBIN: 15 g/dL (ref 11.1–15.9)
Hematocrit: 45.2 % (ref 34.0–46.6)
MCH: 30.7 pg (ref 26.6–33.0)
MCHC: 33.2 g/dL (ref 31.5–35.7)
MCV: 92 fL (ref 79–97)
PLATELETS: 315 10*3/uL (ref 150–450)
RBC: 4.89 x10E6/uL (ref 3.77–5.28)
RDW: 13.8 % (ref 12.3–15.4)
WBC: 5.3 10*3/uL (ref 3.4–10.8)

## 2017-10-27 LAB — LIPID PANEL
CHOLESTEROL TOTAL: 227 mg/dL — AB (ref 100–199)
Chol/HDL Ratio: 2.9 ratio (ref 0.0–4.4)
HDL: 77 mg/dL (ref >39)
LDL Calculated: 135 mg/dL — ABNORMAL HIGH (ref 0–99)
Triglycerides: 77 mg/dL (ref 0–149)
VLDL CHOLESTEROL CAL: 15 mg/dL (ref 5–40)

## 2017-10-27 LAB — VITAMIN D 25 HYDROXY (VIT D DEFICIENCY, FRACTURES): VIT D 25 HYDROXY: 27.7 ng/mL — AB (ref 30.0–100.0)

## 2017-12-05 ENCOUNTER — Encounter: Payer: Self-pay | Admitting: Certified Nurse Midwife

## 2018-08-30 ENCOUNTER — Other Ambulatory Visit: Payer: Self-pay | Admitting: Certified Nurse Midwife

## 2018-08-30 DIAGNOSIS — Z1231 Encounter for screening mammogram for malignant neoplasm of breast: Secondary | ICD-10-CM

## 2018-10-31 ENCOUNTER — Ambulatory Visit
Admission: RE | Admit: 2018-10-31 | Discharge: 2018-10-31 | Disposition: A | Discharge status: Home / Self Care | Payer: 59 | Source: Ambulatory Visit | Attending: Certified Nurse Midwife | Admitting: Certified Nurse Midwife

## 2018-10-31 ENCOUNTER — Other Ambulatory Visit: Payer: Self-pay

## 2018-10-31 DIAGNOSIS — Z1231 Encounter for screening mammogram for malignant neoplasm of breast: Secondary | ICD-10-CM

## 2018-12-28 ENCOUNTER — Other Ambulatory Visit: Payer: Self-pay

## 2018-12-31 NOTE — Progress Notes (Signed)
58 y.o. G2P2 Married  Caucasian Fe here for annual exam. Menopausal symptoms have resolved. Having vaginal dryness and not treating at this. Denies vaginal bleeding.  Asthma is now on injectable medication once monthly. No other health issues today. Brought insurance sheet to submit labs when completed.  Patient's last menstrual period was 11/14/2011 (approximate).          Sexually active: Yes.    The current method of family planning is tubal ligation.    Exercising: Yes.    walking Smoker:  no  Review of Systems  Constitutional: Negative.   HENT: Negative.   Eyes: Negative.   Respiratory: Negative.   Cardiovascular: Negative.   Gastrointestinal: Negative.   Genitourinary: Negative.   Musculoskeletal: Negative.   Skin: Negative.   Neurological: Negative.   Endo/Heme/Allergies: Negative.   Psychiatric/Behavioral: Negative.     Health Maintenance: Pap:  08-19-16 neg History of Abnormal Pap: no MMG:  10-31-2018 category c density birads 1:neg Self Breast exams: yes Colonoscopy:  9/16  F/u 81yrs BMD:   2011 TDaP:  2013 Shingles: not done Pneumonia: 2012 Hep C and HIV: both neg 2016 Labs: scheduled fasting labs IFOB dispensed   reports that she has never smoked. She has never used smokeless tobacco. She reports current alcohol use. She reports that she does not use drugs.  Past Medical History:  Diagnosis Date  . Allergic rhinitis, cause unspecified   . Anemia   . Anxiety and depression    with previous medication use  . Asthma   . Rhinosinusitis     Past Surgical History:  Procedure Laterality Date  . BUNIONECTOMY     left  . CESAREAN SECTION     x2  . CHOLECYSTECTOMY    . HEMORRHOID BANDING    . left radius fracture    . TONSILLECTOMY    . TUBAL LIGATION      Current Outpatient Medications  Medication Sig Dispense Refill  . amphetamine-dextroamphetamine (ADDERALL XR) 15 MG 24 hr capsule Take by mouth daily.  0  . BREO ELLIPTA 200-25 MCG/INH AEPB INHALE 1  PUFF INTO THE LUNGS DAILY. 180 each 2  . Cyanocobalamin (VITAMIN B-12) 2500 MCG SUBL Place 1 tablet under the tongue as needed.     Marland Kitchen EPIPEN 2-PAK 0.3 MG/0.3ML SOAJ injection See admin instructions.  1  . fluticasone (FLONASE) 50 MCG/ACT nasal spray USE 2 SPRAYS IN EACH NOSTRIL TWICE A DAY (Patient taking differently: USE 2 SPRAYS IN EACH NOSTRIL AS NEEDED) 48 g 1  . ipratropium (ATROVENT) 0.02 % nebulizer solution Take 2.5 mLs (0.5 mg total) by nebulization every 6 (six) hours as needed for wheezing or shortness of breath. 62.5 mL 0  . levalbuterol (XOPENEX) 0.63 MG/3ML nebulizer solution 1 neb every 6 hours when needed 225 mL 3  . montelukast (SINGULAIR) 10 MG tablet TAKE 1 TABLET (10 MG TOTAL) BY MOUTH AT BEDTIME. 90 tablet 1  . PROAIR HFA 108 (90 Base) MCG/ACT inhaler USE 2 PUFFS EVERY 4 TO 6 HOURS AS NEEDED (RESCUE) 8.5 Inhaler 0  . UNABLE TO FIND Allergy shots weekly     Current Facility-Administered Medications  Medication Dose Route Frequency Provider Last Rate Last Dose  . Mepolizumab SOLR 100 mg  100 mg Subcutaneous Q28 days Deneise Lever, MD        Family History  Problem Relation Age of Onset  . Emphysema Mother   . Cancer Mother        lung  . Hypertension Mother   .  Heart disease Mother   . ALS Father   . Osteopenia Father   . Other Brother        born with one kidney  . Lupus Maternal Grandmother   . Hypertension Brother        2  . Breast cancer Maternal Aunt     ROS:  Pertinent items are noted in HPI.  Otherwise, a comprehensive ROS was negative.  Exam:   LMP 11/14/2011 (Approximate)    Ht Readings from Last 3 Encounters:  10/25/17 5' 2.75" (1.594 m)  07/06/17 5' 2.75" (1.594 m)  08/19/16 5' 2.75" (1.594 m)    General appearance: alert, cooperative and appears stated age Head: Normocephalic, without obvious abnormality, atraumatic Neck: no adenopathy, supple, symmetrical, trachea midline and thyroid normal to inspection and palpation Lungs: clear to  auscultation bilaterally Breasts: normal appearance, no masses or tenderness, No nipple retraction or dimpling, No nipple discharge or bleeding, No axillary or supraclavicular adenopathy Heart: regular rate and rhythm Abdomen: soft, non-tender; no masses,  no organomegaly Extremities: extremities normal, atraumatic, no cyanosis or edema Skin: Skin color, texture, turgor normal. No rashes or lesions Lymph nodes: Cervical, supraclavicular, and axillary nodes normal. No abnormal inguinal nodes palpated Neurologic: Grossly normal   Pelvic: External genitalia:  no lesions              Urethra:  normal appearing urethra with no masses, tenderness or lesions              Bartholin's and Skene's: normal                 Vagina: normal appearing vagina with normal color and discharge, no lesions              Cervix: no cervical motion tenderness, no lesions and normal appearance              Pap taken: No. Bimanual Exam:  Uterus:  normal size, contour, position, consistency, mobility, non-tender and anteverted              Adnexa: normal adnexa and no mass, fullness, tenderness               Rectovaginal: Confirms               Anus:  normal sphincter tone, no lesions  Chaperone present: yes  A:  Well Woman with normal exam  Menopausal no HRT  Overweight  Chronic asthma with Pulmonary management    P:   Reviewed health and wellness pertinent to exam  Aware of need to advise if vaginal bleeding or dryness changes  Discussed weight gain and patient is working on again with some exercise  Continue follow up with MD as indicated  IFOB screening dispensed with instructions  Screening labs scheduled, CMP,Lipid panel, CBC, TSH, Vitamin D  Pap smear: no   counseled on breast self exam, mammography screening, feminine hygiene, adequate intake of calcium and vitamin D, diet and exercise  return annually or prn  An After Visit Summary was printed and given to the patient.

## 2019-01-01 ENCOUNTER — Other Ambulatory Visit: Payer: Self-pay

## 2019-01-01 ENCOUNTER — Encounter: Payer: Self-pay | Admitting: Certified Nurse Midwife

## 2019-01-01 ENCOUNTER — Ambulatory Visit (INDEPENDENT_AMBULATORY_CARE_PROVIDER_SITE_OTHER): Payer: 59 | Admitting: Certified Nurse Midwife

## 2019-01-01 VITALS — BP 110/64 | HR 68 | Temp 97.3°F | Resp 16 | Ht 62.75 in | Wt 215.0 lb

## 2019-01-01 DIAGNOSIS — Z0000 Encounter for general adult medical examination without abnormal findings: Secondary | ICD-10-CM | POA: Diagnosis not present

## 2019-01-01 DIAGNOSIS — Z01419 Encounter for gynecological examination (general) (routine) without abnormal findings: Secondary | ICD-10-CM

## 2019-01-01 DIAGNOSIS — E559 Vitamin D deficiency, unspecified: Secondary | ICD-10-CM | POA: Diagnosis not present

## 2019-01-01 DIAGNOSIS — Z1211 Encounter for screening for malignant neoplasm of colon: Secondary | ICD-10-CM

## 2019-01-01 NOTE — Patient Instructions (Signed)

## 2019-01-02 ENCOUNTER — Other Ambulatory Visit (INDEPENDENT_AMBULATORY_CARE_PROVIDER_SITE_OTHER): Payer: 59

## 2019-01-02 DIAGNOSIS — E559 Vitamin D deficiency, unspecified: Secondary | ICD-10-CM

## 2019-01-02 DIAGNOSIS — Z0000 Encounter for general adult medical examination without abnormal findings: Secondary | ICD-10-CM

## 2019-01-03 ENCOUNTER — Other Ambulatory Visit: Payer: Self-pay | Admitting: Certified Nurse Midwife

## 2019-01-03 ENCOUNTER — Telehealth: Payer: Self-pay | Admitting: Certified Nurse Midwife

## 2019-01-03 LAB — COMPREHENSIVE METABOLIC PANEL
ALT: 26 IU/L (ref 0–32)
AST: 29 IU/L (ref 0–40)
Albumin/Globulin Ratio: 1.7 (ref 1.2–2.2)
Albumin: 4 g/dL (ref 3.8–4.9)
Alkaline Phosphatase: 105 IU/L (ref 39–117)
BUN/Creatinine Ratio: 17 (ref 9–23)
BUN: 14 mg/dL (ref 6–24)
Bilirubin Total: 0.4 mg/dL (ref 0.0–1.2)
CO2: 22 mmol/L (ref 20–29)
Calcium: 8.9 mg/dL (ref 8.7–10.2)
Chloride: 107 mmol/L — ABNORMAL HIGH (ref 96–106)
Creatinine, Ser: 0.83 mg/dL (ref 0.57–1.00)
GFR calc Af Amer: 91 mL/min/{1.73_m2} (ref >59)
GFR calc non Af Amer: 79 mL/min/{1.73_m2} (ref >59)
Globulin, Total: 2.4 g/dL (ref 1.5–4.5)
Glucose: 91 mg/dL (ref 65–99)
Potassium: 4.4 mmol/L (ref 3.5–5.2)
Sodium: 142 mmol/L (ref 134–144)
Total Protein: 6.4 g/dL (ref 6.0–8.5)

## 2019-01-03 LAB — CBC
Hematocrit: 42.6 % (ref 34.0–46.6)
Hemoglobin: 14.4 g/dL (ref 11.1–15.9)
MCH: 31.9 pg (ref 26.6–33.0)
MCHC: 33.8 g/dL (ref 31.5–35.7)
MCV: 94 fL (ref 79–97)
Platelets: 234 10*3/uL (ref 150–450)
RBC: 4.52 x10E6/uL (ref 3.77–5.28)
RDW: 12.6 % (ref 11.7–15.4)
WBC: 6 10*3/uL (ref 3.4–10.8)

## 2019-01-03 LAB — LIPID PANEL
Chol/HDL Ratio: 3 ratio (ref 0.0–4.4)
Cholesterol, Total: 221 mg/dL — ABNORMAL HIGH (ref 100–199)
HDL: 73 mg/dL (ref >39)
LDL Calculated: 133 mg/dL — ABNORMAL HIGH (ref 0–99)
Triglycerides: 75 mg/dL (ref 0–149)
VLDL Cholesterol Cal: 15 mg/dL (ref 5–40)

## 2019-01-03 LAB — VITAMIN D 25 HYDROXY (VIT D DEFICIENCY, FRACTURES): Vit D, 25-Hydroxy: 25.3 ng/mL — ABNORMAL LOW (ref 30.0–100.0)

## 2019-01-03 LAB — TSH: TSH: 2.11 u[IU]/mL (ref 0.450–4.500)

## 2019-01-03 LAB — HEMOGLOBIN A1C
Est. average glucose Bld gHb Est-mCnc: 105 mg/dL
Hgb A1c MFr Bld: 5.3 % (ref 4.8–5.6)

## 2019-01-03 NOTE — Telephone Encounter (Signed)
Patient checking on lab results from yesterday.

## 2019-01-03 NOTE — Telephone Encounter (Signed)
Amy Rollins, CNM -please review lab results dated 01/02/19 and advise.

## 2019-01-03 NOTE — Telephone Encounter (Signed)
Spoke with patient. Patient requesting to discuss options for anxiety. States her boys have been telling her that she is yelling more often over the past few months. Denies job changes or lifestyle changes. States "everyone has been home since March 2020". Denies GYN concerns. Feels safe, denies SI/HI. Patient was in for AEX 01/01/19, forgot to discuss. Recommended OV or virtual visit to further discuss. Patient request Virtual OV, scheduled for 8/24 at 11:30am with [name], CNM. Advised patient [name], CNM is out of the office today, our office will notify once results have been reviewed. Patient agreeable.   Routing to provider for final review. Patient is agreeable to disposition. Will close encounter.

## 2019-01-03 NOTE — Telephone Encounter (Signed)
Neoma Laming,    I wanted to touch base with you about the anxiety I am experiencing. My children say I yell at them all the time. Is this something you can handle?    Thank you,    Gregor Hams

## 2019-01-04 ENCOUNTER — Other Ambulatory Visit: Payer: Self-pay

## 2019-01-04 DIAGNOSIS — E559 Vitamin D deficiency, unspecified: Secondary | ICD-10-CM

## 2019-01-07 ENCOUNTER — Telehealth (INDEPENDENT_AMBULATORY_CARE_PROVIDER_SITE_OTHER): Payer: 59 | Admitting: Certified Nurse Midwife

## 2019-01-07 DIAGNOSIS — F4323 Adjustment disorder with mixed anxiety and depressed mood: Secondary | ICD-10-CM

## 2019-01-07 NOTE — Progress Notes (Addendum)
Review of Systems  Constitutional: Negative.   HENT: Negative.   Eyes: Negative.   Respiratory: Negative.   Cardiovascular: Negative.   Gastrointestinal: Negative.   Genitourinary: Negative.   Musculoskeletal: Negative.   Skin: Negative.   Neurological: Negative.        She is taking Adderral 15 mg prn as needed to help with attention.  Endo/Heme/Allergies: Negative.   Psychiatric/Behavioral: The patient is nervous/anxious.    Time of attempted Web Visit 12:10 pm, converted to telephone consultation, could not connect from patient's computer.  Patient verbalized consent to phone visit.  58 yo g2p2002 married white female complaining of being more agitated with teen sons who are at home due to the Covid 19  from college. They are complaining" she is yelling at them all the time". She agrees that this is the situation. She also is having episodic creying occurrences. Spouse  Feels she is overly anxious at times and might need medication. Patient currently seeing Amy Johnnye Sima DO for her ADHD and uses Adderal as needed.. She does feel calmer once she uses medication, but has not used recently. She denies any concerns or thoughts of self harm or others. Just feel this is getting out hand. She is the primary person that governs the household and all responsibilities. She just would like for " all to be back to normal again". Calmness and no more crying would be great. Eating good diet and some exercise. Occasional hot flash, but no issues.  A: ADHD with sporadic Adderall use   Suspect Anxiety/Depression situational  P: Discussed concerns with possible anxiety/depression and medication use and interim Adderall use. Suggested she make appointment with her Dr. Who prescribes the medication and the counselor in that office to evaluate for medication use or change.Concerns expressed with our management of anxiety and depression with Adderral use. Patient agrees with concerns and feels seeing Dr.  Johnnye Sima is a good idea. Patient to call and schedule appointment. She will advise if further assisted needed from our office. Discussed if thoughts of self harm or others to seek 911 or ER.   Rv prn  Phone called ended at 12:40 p.m. 30 minute consultation time in phone conversation

## 2019-01-08 NOTE — Patient Instructions (Signed)

## 2019-01-19 LAB — FECAL OCCULT BLOOD, IMMUNOCHEMICAL: Fecal Occult Bld: POSITIVE — AB

## 2019-04-02 ENCOUNTER — Other Ambulatory Visit (INDEPENDENT_AMBULATORY_CARE_PROVIDER_SITE_OTHER): Payer: 59

## 2019-04-02 ENCOUNTER — Other Ambulatory Visit: Payer: Self-pay

## 2019-04-02 ENCOUNTER — Telehealth: Payer: Self-pay

## 2019-04-02 DIAGNOSIS — E559 Vitamin D deficiency, unspecified: Secondary | ICD-10-CM

## 2019-04-02 NOTE — Telephone Encounter (Signed)
Patient came in for lab visit & while here wanted to ask if you received anything from her provider amy stevenson. Patient was wanting to start on something for anxiety per amy stevensons recommendations. Patient did a virtual visit with you regarding & you wanted her to meet with her other provider first. Patient checking to see if you received any documentation from her provider.

## 2019-04-03 LAB — VITAMIN D 25 HYDROXY (VIT D DEFICIENCY, FRACTURES): Vit D, 25-Hydroxy: 21.1 ng/mL — ABNORMAL LOW (ref 30.0–100.0)

## 2019-04-05 ENCOUNTER — Other Ambulatory Visit: Payer: Self-pay

## 2019-04-05 MED ORDER — VITAMIN D (ERGOCALCIFEROL) 1.25 MG (50000 UNIT) PO CAPS: 50000.0000 [IU] | ORAL_CAPSULE | ORAL | 0 refills | Status: DC

## 2019-05-29 ENCOUNTER — Telehealth: Payer: Self-pay

## 2019-05-29 NOTE — Telephone Encounter (Signed)
Patient notified of results as written by provider. Bone density to be scanned in.

## 2019-05-29 NOTE — Telephone Encounter (Signed)
Patient returned call

## 2019-05-29 NOTE — Telephone Encounter (Signed)
Called patient to see if the provider who ordered her bone density had called her with results yet. We received a copy. Mailbox full & unable to leave a message.

## 2019-06-21 ENCOUNTER — Other Ambulatory Visit: Payer: Self-pay | Admitting: Certified Nurse Midwife

## 2019-06-21 NOTE — Telephone Encounter (Signed)
Patient has appt for recheck of vitamin d 07-09-2019. rx sent for 1 more month of vitamin d 50,000iu once weekly to pharmacy to get her to her lab appt. Okayed per [employer], cnm

## 2019-07-05 ENCOUNTER — Telehealth: Payer: Self-pay | Admitting: Certified Nurse Midwife

## 2019-07-05 ENCOUNTER — Other Ambulatory Visit: Payer: Self-pay

## 2019-07-05 NOTE — Telephone Encounter (Signed)
Erroneous encounter

## 2019-07-08 ENCOUNTER — Other Ambulatory Visit: Payer: Self-pay

## 2019-07-09 ENCOUNTER — Other Ambulatory Visit (INDEPENDENT_AMBULATORY_CARE_PROVIDER_SITE_OTHER): Payer: 59

## 2019-07-09 ENCOUNTER — Other Ambulatory Visit: Payer: Self-pay

## 2019-07-09 ENCOUNTER — Other Ambulatory Visit: Payer: Self-pay | Admitting: *Deleted

## 2019-07-09 DIAGNOSIS — E559 Vitamin D deficiency, unspecified: Secondary | ICD-10-CM

## 2019-07-10 LAB — VITAMIN D 25 HYDROXY (VIT D DEFICIENCY, FRACTURES): Vit D, 25-Hydroxy: 42.2 ng/mL (ref 30.0–100.0)

## 2019-07-12 ENCOUNTER — Telehealth: Payer: Self-pay

## 2019-07-12 NOTE — Telephone Encounter (Signed)
-----   Message from Megan Salon, MD sent at 07/11/2019  6:18 AM EST ----- Please let pt know her Vit D is now normal.  Debbi has been seeing her.  Not sure why the lab came to me.  She needs to continue OTC Vit D 2000 IU a day.  Should recheck next year.  You might want to tell her about Debbi's retirement and go ahead and schedule her with another provider.  Thanks.

## 2019-07-12 NOTE — Telephone Encounter (Signed)
Mailbox full. Try again. 

## 2019-07-16 NOTE — Telephone Encounter (Signed)
Left message for call back.

## 2019-07-18 ENCOUNTER — Other Ambulatory Visit: Payer: Self-pay | Admitting: Certified Nurse Midwife

## 2019-07-19 NOTE — Telephone Encounter (Signed)
Patient states she will have them send it again.

## 2019-07-19 NOTE — Telephone Encounter (Signed)
Patient asking if you still received anything yet from amy stevenson. Patient is to have her resend info over to you just incase you never received anything

## 2019-07-19 NOTE — Telephone Encounter (Signed)
Patient notified of results. See lab 

## 2019-07-19 NOTE — Telephone Encounter (Signed)
Have not received yet

## 2019-08-05 ENCOUNTER — Encounter: Payer: Self-pay | Admitting: Certified Nurse Midwife

## 2019-12-17 ENCOUNTER — Other Ambulatory Visit: Payer: Self-pay | Admitting: Certified Nurse Midwife

## 2019-12-17 DIAGNOSIS — Z1231 Encounter for screening mammogram for malignant neoplasm of breast: Secondary | ICD-10-CM

## 2019-12-25 NOTE — Progress Notes (Signed)
59 y.o. G2P2 [name] here for annual exam.  Doing well.  Oldest is a Paramedic at [employer].  She does have some worry about him because he has not been vaccinated.  Betsy Coder is a Museum/gallery exhibitions officer at [employer].  Denies vaginal bleeding.    Had colonoscopy in October.  Had hemorrhoid banding in the past.  Still has occasional rectal bleeding with certain foods.  Patient's last menstrual period was 11/14/2011 (approximate).          Sexually active: Yes.    The current method of family planning is tubal ligation.    Exercising: Yes.    some Smoker:  no  Health Maintenance: Pap:  05-01-15 neg HPV HR neg, 08-19-16 neg History of abnormal Pap:  no MMG:  10-31-2018 category c density birads 1:neg, scheduled for 01-14-2020 Colonoscopy:  03/07/2019 Dr. Earlean Shawl, follow up 5 yearsBMD:   2020 TDaP:  2013 Pneumonia vaccine(s): 2012, allergic reaction Shingrix:   Not done Hep C testing: neg 2016 Screening Labs: 12/2018   reports that she has never smoked. She has never used smokeless tobacco. She reports previous alcohol use. She reports that she does not use drugs.  Past Medical History:  Diagnosis Date  . Allergic rhinitis, cause unspecified   . Anemia   . Anxiety and depression    with previous medication use  . Asthma   . Rhinosinusitis     Past Surgical History:  Procedure Laterality Date  . BUNIONECTOMY     left  . CESAREAN SECTION     x2  . CHOLECYSTECTOMY    . HEMORRHOID BANDING    . left radius fracture    . TONSILLECTOMY    . TUBAL LIGATION      Current Outpatient Medications  Medication Sig Dispense Refill  . BREO ELLIPTA 200-25 MCG/INH AEPB INHALE 1 PUFF INTO THE LUNGS DAILY. 180 each 2  . CALCIUM PO Take by mouth.    . Cyanocobalamin (VITAMIN B-12) 2500 MCG SUBL Place 1 tablet under the tongue as needed.     Marland Kitchen EPIPEN 2-PAK 0.3 MG/0.3ML SOAJ injection See admin instructions.  1  . fexofenadine (ALLEGRA) 180 MG tablet Take 1 tablet by mouth daily as needed.     . Fluticasone Propionate (XHANCE) 93 MCG/ACT EXHU Place into the nose.    . Mepolizumab 100 MG/ML SOAJ Inject into the skin.    . montelukast (SINGULAIR) 10 MG tablet TAKE 1 TABLET (10 MG TOTAL) BY MOUTH AT BEDTIME. 90 tablet 1  . omeprazole (PRILOSEC) 40 MG capsule Take by mouth.    Marland Kitchen PROAIR HFA 108 (90 Base) MCG/ACT inhaler USE 2 PUFFS EVERY 4 TO 6 HOURS AS NEEDED (RESCUE) 8.5 Inhaler 0  . UNABLE TO FIND 2 shots a week allergy    . Vitamin D, Ergocalciferol, (DRISDOL) 1.25 MG (50000 UNIT) CAPS capsule TAKE 1 CAPSULE (50,000 UNITS TOTAL) BY MOUTH EVERY 7 (SEVEN) DAYS. 4 capsule 0   No current facility-administered medications for this visit.    Family History  Problem Relation Age of Onset  . Emphysema Mother   . Cancer Mother        lung  . Hypertension Mother   . Heart disease Mother   . ALS Father   . Osteopenia Father   . Other Brother        born with one kidney  . Lupus Maternal Grandmother   . Hypertension Brother        2  . Breast cancer  Maternal Aunt     Review of Systems  Constitutional: Negative.   HENT: Negative.   Eyes: Negative.   Respiratory: Negative.   Cardiovascular: Negative.   Gastrointestinal: Negative.   Endocrine: Negative.   Genitourinary: Negative.   Musculoskeletal: Negative.   Skin: Negative.   Allergic/Immunologic: Negative.   Neurological: Negative.   Hematological: Negative.   Psychiatric/Behavioral: Negative.     Exam:   Ht 5' 3.25" (1.607 m)   Wt 212 lb (96.2 kg)   LMP 11/14/2011 (Approximate)   BMI 37.26 kg/m   Height: 5' 3.25" (160.7 cm)  General appearance: alert, cooperative and appears stated age Head: Normocephalic, without obvious abnormality, atraumatic Neck: no adenopathy, supple, symmetrical, trachea midline and thyroid normal to inspection and palpation Lungs: clear to auscultation bilaterally Breasts: normal appearance, no masses or tenderness Heart: regular rate and rhythm Abdomen: soft, non-tender; bowel  sounds normal; no masses,  no organomegaly Extremities: extremities normal, atraumatic, no cyanosis or edema Skin: Skin color, texture, turgor normal. No rashes or lesions Lymph nodes: Cervical, supraclavicular, and axillary nodes normal. No abnormal inguinal nodes palpated Neurologic: Grossly normal   Pelvic: External genitalia:  no lesions              Urethra:  normal appearing urethra with no masses, tenderness or lesions              Bartholins and Skenes: normal                 Vagina: normal appearing vagina with normal color and discharge, no lesions              Cervix: no lesions              Pap taken: Yes.   Bimanual Exam:  Uterus:  normal size, contour, position, consistency, mobility, non-tender              Adnexa: normal adnexa and no mass, fullness, tenderness               Rectovaginal: Confirms               Anus:  normal sphincter tone, no lesions  Chaperone, Olene Floss, CMA, was present for exam.  A:  Well Woman with normal exam PMP, no HRT Asthma Hemorrhoids  P:   Mammogram guidelines reviewed Colonoscopy done 02/2019 pap smear with HR HPV obtained today Lipids, Hb A1C and Vit D obtained today Vit D rx for 50K weekly to pharmacy.  #12/4RF Return annually or prn

## 2020-01-03 ENCOUNTER — Other Ambulatory Visit (HOSPITAL_COMMUNITY)
Admission: RE | Admit: 2020-01-03 | Discharge: 2020-01-03 | Disposition: A | Discharge status: Home / Self Care | Payer: 59 | Source: Ambulatory Visit | Attending: Obstetrics & Gynecology | Admitting: Obstetrics & Gynecology

## 2020-01-03 ENCOUNTER — Ambulatory Visit (INDEPENDENT_AMBULATORY_CARE_PROVIDER_SITE_OTHER): Payer: 59 | Admitting: Obstetrics & Gynecology

## 2020-01-03 ENCOUNTER — Encounter: Payer: Self-pay | Admitting: Obstetrics & Gynecology

## 2020-01-03 ENCOUNTER — Other Ambulatory Visit: Payer: Self-pay

## 2020-01-03 ENCOUNTER — Ambulatory Visit: Payer: 59 | Admitting: Certified Nurse Midwife

## 2020-01-03 VITALS — BP 120/70 | HR 70 | Resp 16 | Ht 63.25 in | Wt 212.0 lb

## 2020-01-03 DIAGNOSIS — Z0000 Encounter for general adult medical examination without abnormal findings: Secondary | ICD-10-CM | POA: Diagnosis not present

## 2020-01-03 DIAGNOSIS — Z124 Encounter for screening for malignant neoplasm of cervix: Secondary | ICD-10-CM | POA: Insufficient documentation

## 2020-01-03 DIAGNOSIS — E559 Vitamin D deficiency, unspecified: Secondary | ICD-10-CM | POA: Diagnosis not present

## 2020-01-03 DIAGNOSIS — Z01419 Encounter for gynecological examination (general) (routine) without abnormal findings: Secondary | ICD-10-CM | POA: Diagnosis not present

## 2020-01-03 MED ORDER — VITAMIN D (ERGOCALCIFEROL) 1.25 MG (50000 UNIT) PO CAPS
50000.0000 [IU] | ORAL_CAPSULE | ORAL | 4 refills | Status: DC
Start: 2020-01-03 — End: 2022-07-12

## 2020-01-04 LAB — LIPID PANEL
Chol/HDL Ratio: 3.1 ratio (ref 0.0–4.4)
Cholesterol, Total: 228 mg/dL — ABNORMAL HIGH (ref 100–199)
HDL: 74 mg/dL (ref >39)
LDL Chol Calc (NIH): 140 mg/dL — ABNORMAL HIGH (ref 0–99)
Triglycerides: 82 mg/dL (ref 0–149)
VLDL Cholesterol Cal: 14 mg/dL (ref 5–40)

## 2020-01-04 LAB — VITAMIN D 25 HYDROXY (VIT D DEFICIENCY, FRACTURES): Vit D, 25-Hydroxy: 35.1 ng/mL (ref 30.0–100.0)

## 2020-01-04 LAB — HEMOGLOBIN A1C
Est. average glucose Bld gHb Est-mCnc: 117 mg/dL
Hgb A1c MFr Bld: 5.7 % — ABNORMAL HIGH (ref 4.8–5.6)

## 2020-01-07 LAB — CYTOLOGY - PAP
Comment: NEGATIVE
Diagnosis: NEGATIVE
High risk HPV: NEGATIVE

## 2020-01-14 ENCOUNTER — Ambulatory Visit
Admission: RE | Admit: 2020-01-14 | Discharge: 2020-01-14 | Disposition: A | Discharge status: Home / Self Care | Payer: 59 | Source: Ambulatory Visit | Attending: Cardiology | Admitting: Cardiology

## 2020-01-14 ENCOUNTER — Other Ambulatory Visit: Payer: Self-pay

## 2020-01-14 DIAGNOSIS — Z1231 Encounter for screening mammogram for malignant neoplasm of breast: Secondary | ICD-10-CM

## 2020-12-08 ENCOUNTER — Telehealth: Payer: Self-pay | Admitting: Internal Medicine

## 2020-12-08 NOTE — Telephone Encounter (Signed)
Happy to see her again- ok to use a held spot

## 2020-12-08 NOTE — Telephone Encounter (Signed)
CY please advise if you are willing to see this pt again.  Last seen 07/2017 for her Asthma.

## 2020-12-09 NOTE — Telephone Encounter (Signed)
Left message for patient that it is ok to schedule appointment.

## 2020-12-10 ENCOUNTER — Ambulatory Visit (INDEPENDENT_AMBULATORY_CARE_PROVIDER_SITE_OTHER): Payer: 59

## 2020-12-10 ENCOUNTER — Ambulatory Visit (INDEPENDENT_AMBULATORY_CARE_PROVIDER_SITE_OTHER): Payer: 59 | Admitting: Internal Medicine

## 2020-12-10 ENCOUNTER — Encounter: Payer: Self-pay | Admitting: Internal Medicine

## 2020-12-10 ENCOUNTER — Other Ambulatory Visit: Payer: Self-pay

## 2020-12-10 VITALS — BP 124/72 | HR 111 | Ht 63.25 in | Wt 211.0 lb

## 2020-12-10 DIAGNOSIS — U099 Post covid-19 condition, unspecified: Secondary | ICD-10-CM

## 2020-12-10 DIAGNOSIS — J4541 Moderate persistent asthma with (acute) exacerbation: Secondary | ICD-10-CM | POA: Diagnosis not present

## 2020-12-10 DIAGNOSIS — J3089 Other allergic rhinitis: Secondary | ICD-10-CM

## 2020-12-10 DIAGNOSIS — J302 Other seasonal allergic rhinitis: Secondary | ICD-10-CM

## 2020-12-10 DIAGNOSIS — J45909 Unspecified asthma, uncomplicated: Secondary | ICD-10-CM

## 2020-12-10 MED ORDER — XHANCE 93 MCG/ACT NA EXHU: INHALANT_SUSPENSION | NASAL | 12 refills | Status: DC

## 2020-12-10 MED ORDER — TRELEGY ELLIPTA 200-62.5-25 MCG/INH IN AEPB: 1.0000 | INHALATION_SPRAY | Freq: Every day | RESPIRATORY_TRACT | 0 refills | Status: DC

## 2020-12-10 NOTE — Patient Instructions (Signed)
Order- CXR  dx Asthma exacerbation, Covid infection June, 2022  Order- sample x 2 [id number]   inhale 1 puff then rinse mouth well, once daily. Try this for now instead of Breo. If you like it better, let us know.  Script sent for Xhance 1 puff each nostril twice daily,

## 2020-12-10 NOTE — Progress Notes (Signed)
Patient ID: Amy Rollins, female    DOB: [birth year], 60 y.o.   MRN: [id number]  HPI F never smoker followed for asthma, allergic rhinitis PFT: 01/28/2010 within normal limits with insignificant response to bronchodilator. Mild decrease in diffusion capacity. FVC 2.70/81%, FEV1 2.22/87%, FEV1/FEC 0.82. TLC 90%, DLCO 75%. CT max fac 10/20/14- IMPRESSION: Diffuse sinus disease changes as above Allergy profile 10/20/14- Total IgE 263, elevated grass pollens  CBC- 10/20/14  EOS absolute 0.8 (H) Office Spirometry 12/21/2015-moderate restrictive and obstructive changes with reduced exhaled volume. FVC 1.73/51%, FEV1 1.33/50%, ratio 77%, FEF 25-75 1.10/43% FENO 04/21/16- 28, she reports score 15 at her allergy office. --------------------------------- 07/06/17- 60 year old female never smoker followed for Asthma/ (Nucala since 01/21/16), allergic rhinitis, chronic rhinosinusitis  ---29yrf/u for asthma. States asthma has been doing well since starting Nucala injections  She credits [facility] in her [facility].  Occasional wheeze and need for rescue inhaler but very much improved.  Much more confident going outside for walks now. She says her allergist wanted her to [place], but she really is not needing active pulmonary care separate from her asthma/allergy management. Incidental heartburn.  I recommended Pepcid and asked her to speak to her PCP about this.  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 Treated then at home with no antivirals. Using  rescue hfa several times daily, esp with activity and at least once at night.  Xhance helped her nasal congestion much better than previous trials with Rhinocort, Flonase. Needs it represcribed.  Prior to Admission medications   Medication Sig Start Date End Date Taking? Authorizing Provider  BREO ELLIPTA 200-25 MCG/INH AEPB INHALE 1 PUFF INTO THE LUNGS DAILY. 11/29/16  Yes YBaird LyonsD, MD  CALCIUM PO Take by mouth.   Yes [provider]  Cyanocobalamin (VITAMIN B-12) 2500 MCG SUBL Place 1 tablet under the tongue as needed.    Yes [provider]  EPIPEN 2-PAK 0.3 MG/0.3ML SOAJ injection See admin instructions. 03/14/16  Yes [provider]  fexofenadine (ALLEGRA) 180 MG tablet Take 1 tablet by mouth daily as needed.   Yes [provider]  Fluticasone Propionate (XHANCE) 93 MCG/ACT [place] into the nose.   Yes [provider]  Fluticasone Propionate (XHANCE) 93 MCG/ACT EXHU 1 puff each nostril twice daily 12/10/20  Yes Vivyan Biggers D, MD  Fluticasone-Umeclidin-Vilant (TRELEGY ELLIPTA) 200-62.5-25 MCG/INH AEPB Inhale 1 puff into the lungs daily. 12/10/20  Yes Demitrios Molyneux, CTarri FullerD, MD  Mepolizumab 100 MG/ML SOAJ Inject into the skin.   Yes [provider]  montelukast (SINGULAIR) 10 MG tablet TAKE 1 TABLET (10 MG TOTAL) BY MOUTH AT BEDTIME. 07/18/16  Yes Blaise Grieshaber D, MD  omeprazole (PRILOSEC) 40 MG capsule Take by mouth. 12/18/19  Yes [provider]  PROAIR HFA 108 (90 Base) MCG/ACT inhaler USE 2 PUFFS EVERY 4 TO 6 HOURS AS NEEDED (RESCUE) 04/18/16  Yes Truong Delcastillo, CKasandra Knudsen MD  UNABLE TO FIND 2 shots a week allergy   Yes [provider]  Vitamin D, Ergocalciferol, (DRISDOL) 1.25 MG (50000 UNIT) CAPS capsule Take 1 capsule (50,000 Units total)  by mouth every 7 (seven) days. 01/03/20  Yes Megan Salon, MD   Past Medical History:  Diagnosis Date   Allergic rhinitis, cause unspecified    Anemia    Anxiety and depression     with previous medication use   Asthma    Rhinosinusitis    Past Surgical History:  Procedure Laterality Date   BUNIONECTOMY     left   CESAREAN SECTION     x2   CHOLECYSTECTOMY     HEMORRHOID BANDING     left radius fracture     TONSILLECTOMY     TUBAL LIGATION     Family History  Problem Relation Age of Onset   Emphysema Mother    Cancer Mother        lung   Hypertension Mother    Heart disease Mother    ALS Father    Osteopenia Father    Other Brother        born with one kidney   Lupus Maternal Grandmother    Hypertension Brother        2   Breast cancer Maternal Aunt    Social History   Socioeconomic History   Marital status: Married    Spouse name: Not on file   Number of children: Not on file   Years of education: Not on file   Highest education level: Not on file  Occupational History   Not on file  Tobacco Use   Smoking status: Never   Smokeless tobacco: Never  Substance and Sexual Activity   Alcohol use: Not Currently    Alcohol/week: 0.0 - 2.0 standard drinks   Drug use: No   Sexual activity: Yes    Partners: Male    Birth control/protection: Surgical    Comment: BTL  Other Topics Concern   Not on file  Social History Narrative   Not on file   Social Determinants of Health   Financial Resource Strain: Not on file  Food Insecurity: Not on file  Transportation Needs: Not on file  Physical Activity: Not on file  Stress: Not on file  Social Connections: Not on file  Intimate Partner Violence: Not on file   ROS-see HPI   + = positive Constitutional:    weight loss, night sweats, fevers, chills, fatigue, lassitude. HEENT:    headaches, difficulty swallowing, tooth/dental problems, +sore throat,       sneezing, itching, ear ache, +nasal congestion, post nasal drip, snoring CV:    chest pain, orthopnea, PND, +swelling in lower extremities, anasarca,                                  dizziness, palpitations Resp:   +shortness of breath with  exertion or at rest.                productive cough,   non-productive cough, coughing up of blood.              change in color of mucus.  wheezing.   Skin:    rash or lesions. GI:  + heartburn, indigestion, abdominal pain, nausea, vomiting, diarrhea,                 change in bowel habits, loss of appetite GU: dysuria, change in color of urine, no urgency or frequency.   flank pain. MS:   joint pain, stiffness, decreased range of motion, back pain. Neuro-  nothing unusual Psych:  change in mood or affect.  depression or anxiety.   memory loss.   OBJ- Physical Exam General- Alert, Oriented, Affect-appropriate, Distress- none acute Skin- rash-none, lesions- none, excoriation- none Lymphadenopathy- none Head- atraumatic            Eyes- Gross vision intact, PERRLA, conjunctivae and secretions clear            Ears- Hearing, canals-normal            Nose- Clear, no-Septal dev, mucus, polyps-not seen, erosion, perforation             Throat- Mallampati II , mucosa clear , drainage- none, tonsils- atrophic Neck- flexible , trachea midline, no stridor , thyroid nl, carotid no bruit Chest - symmetrical excursion , unlabored           Heart/CV- RRR , no murmur , no gallop  , no rub, nl s1 s2                           - JVD- none , edema- none, stasis changes- none, varices- none           Lung- wheeze +, cough- none , dullness-none, rub- none           Chest wall-  Abd-  Br/ Gen/ Rectal- Not done, not indicated Extrem- cyanosis- none, clubbing, none, atrophy- none, strength- nl Neuro- grossly intact to observation      Review of Systems-see HPI + = positive Constitutional:   No-   weight loss, night sweats, fevers, chills, fatigue, lassitude. HEENT:    headaches, No difficulty swallowing, tooth/dental problems,  sore throat,       No-  sneezing, itching, ear ache, +nasal congestion, post nasal drip,  CV:        chest pain, orthopnea, PND, swelling in lower extremities, anasarca,  dizziness, palpitations Resp: No-   shortness of breath with exertion or at rest.             No- productive cough,  No non-productive cough,  No- coughing up of blood.              No-change in color of mucus.  + wheezing.   Skin: No-   rash or lesions. GI:  No-   heartburn, indigestion, abdominal pain, nausea, vomiting, GU:  MS:  No-   joint pain or swelling.  Neuro-     nothing unusual Psych:  No- change in mood or affect. No depression or anxiety.  No memory loss.  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            Eyes- Gross vision intact, PERRLA, conjunctivae clear secretions            Ears- Hearing, canals-normal            Nose-  sniffing, + turbinate edema, no-Septal dev,  polyps, erosion, perforation             Throat- Mallampati III , mucosa clear , drainage- none, tonsils- atrophic Neck- flexible , trachea midline, no stridor , thyroid nl, carotid no bruit Chest - symmetrical excursion , unlabored           Heart/CV- RRR , no murmur , no gallop  , no rub, nl s1 s2                           -  JVD- none , edema- none, stasis changes- none, varices- none           Lung- clear, unlabored, cough-none , dullness-none, rub- none           Chest wall-  Abd-  Br/ Gen/ Rectal- Not done, not indicated Extrem- cyanosis- none, clubbing, none, atrophy- none, strength- nl Neuro- grossly intact to observation

## 2020-12-11 NOTE — Assessment & Plan Note (Addendum)
Her experience is that [place] worked distinctly better than Flonase, Rhinocort. I currently can't see definite polyps but there is obscuring turbinate edema and she has had polyps before. Plan- resume [employer]

## 2020-12-11 NOTE — Assessment & Plan Note (Signed)
Worse since Covid infection 2 months ago Plan- Try Trelegy samples instead of Breo, CXR

## 2020-12-16 ENCOUNTER — Encounter: Payer: Self-pay | Admitting: *Deleted

## 2021-01-03 ENCOUNTER — Telehealth: Payer: Self-pay | Admitting: *Deleted

## 2021-01-03 NOTE — Telephone Encounter (Signed)
Her insurance will not cover the Xhance nasal spray. She should use daily Flonase or Nasonex, 2 sprays each nostril, once daily, until her upcoming office visit with me.

## 2021-01-03 NOTE — Telephone Encounter (Signed)
Received the following message from [facility] about the PA for the [facility] 93 mcg/act exhaler:  Your PA request has been denied. Additional information will be provided in the denial communication. (Message 1140) Your PA request has been denied. Additional information will be provided in the denial communication. (Message 1140)   KEY:  Amy Rollins June 21, 1960  CY please advise. Thanks

## 2021-01-04 NOTE — Telephone Encounter (Signed)
ATC unable to reach left message to call back

## 2021-01-05 NOTE — Telephone Encounter (Signed)
Called pt and there was no answer and her VM was full

## 2021-01-05 NOTE — Telephone Encounter (Signed)
Pt returning missed call. [phone]

## 2021-01-06 NOTE — Telephone Encounter (Signed)
ATC but her VM was full. Will attempt to call back later.

## 2021-01-06 NOTE — Telephone Encounter (Signed)
Patient is returning phone call. Patient phone number is [phone].

## 2021-01-11 ENCOUNTER — Ambulatory Visit: Payer: 59 | Admitting: Internal Medicine

## 2021-01-13 ENCOUNTER — Other Ambulatory Visit: Payer: Self-pay | Admitting: Obstetrics & Gynecology

## 2021-01-13 DIAGNOSIS — Z1231 Encounter for screening mammogram for malignant neoplasm of breast: Secondary | ICD-10-CM

## 2021-01-21 NOTE — Telephone Encounter (Signed)
Patient called regarding message. I went over denial on nasal spray and dr. Thomes Cake. Patient verbalized understanding but still had questions on denial. I informed patient that she could reach out to insurance for more information. Nothing further needed.

## 2021-02-24 ENCOUNTER — Other Ambulatory Visit: Payer: Self-pay

## 2021-02-24 ENCOUNTER — Ambulatory Visit
Admission: RE | Admit: 2021-02-24 | Discharge: 2021-02-24 | Disposition: A | Discharge status: Home / Self Care | Payer: 59 | Source: Ambulatory Visit | Attending: Obstetrics & Gynecology | Admitting: Obstetrics & Gynecology

## 2021-02-24 DIAGNOSIS — Z1231 Encounter for screening mammogram for malignant neoplasm of breast: Secondary | ICD-10-CM

## 2021-03-02 ENCOUNTER — Ambulatory Visit (HOSPITAL_BASED_OUTPATIENT_CLINIC_OR_DEPARTMENT_OTHER): Payer: 59 | Admitting: Obstetrics & Gynecology

## 2021-03-18 ENCOUNTER — Ambulatory Visit: Payer: 59 | Admitting: Internal Medicine

## 2021-04-12 NOTE — Progress Notes (Signed)
Patient ID: Amy Rollins, female    DOB: 06/07/1960, 60 y.o.   MRN: [id number]  HPI F never smoker followed for asthma, allergic rhinitis PFT: 01/28/2010 within normal limits with insignificant response to bronchodilator. Mild decrease in diffusion capacity. FVC 2.70/81%, FEV1 2.22/87%, FEV1/FEC 0.82. TLC 90%, DLCO 75%. CT max fac 10/20/14- IMPRESSION: Diffuse sinus disease changes as above Allergy profile 10/20/14- Total IgE 263, elevated grass pollens  CBC- 10/20/14  EOS absolute 0.8 (H) Office Spirometry 12/21/2015-moderate restrictive and obstructive changes with reduced exhaled volume. FVC 1.73/51%, FEV1 1.33/50%, ratio 77%, FEF 25-75 1.10/43% FENO 04/21/16- 28, she reports score 15 at her allergy office. 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 Treated then at home with no antivirals. Using rescue hfa several times daily, esp with activity and at least once at night.  Xhance helped her nasal congestion much better than previous trials with Rhinocort, Flonase. Needs it represcribed.  04/13/21- 60 year old female never smoker followed for Dyspnea post Covid, Asthma/ (Nucala since 4/0/98), complicated by allergic rhinitis(Dr Orvil Feil), chronic rhinosinusitis, Obesity, Depression,  -  Nucala, Proair hfa, Breo 200/ , Allegra 180, Singulair, Epipen, Covid infection Sept 2021, June 2022,  Covid vax- 2 Phizer Flu vax-had -----Patient feels good overall states that she still has some wheeze that comes through. Also states that she is mostly using rescue inhaler when she is walking/exercising.   ACT score 20 She had thought wheezing and dyspnea on exertion increased after COVID infection but admits it may have been coincidental that she noticed.  Trelegy was no better than Breo.  Insurance will not cover Truett Perna so she is going to try increasing Flonase from 1 to 2 puffs twice daily.  When she first started Nucala it significantly improved her sense of taste and smell/polyps as well as asthma control.  We discussed possible change. CXR 12/11/20- No active cardiopulmonary disease.    ROS-see HPI   + = positive Constitutional:    weight loss, night sweats, fevers, chills, fatigue, lassitude. HEENT:    headaches, difficulty swallowing, tooth/dental problems, sore throat,       sneezing, itching, ear ache, +nasal congestion, post nasal drip, snoring CV:    chest pain, orthopnea, PND, +swelling in lower extremities, anasarca,                                   dizziness, palpitations Resp:   +shortness of breath with exertion or at rest.                productive cough,   non-productive cough, coughing up of blood.              change in color of mucus.  wheezing.   Skin:    rash or lesions. GI:  + heartburn, indigestion, abdominal pain, nausea, vomiting, diarrhea,                 change in bowel habits, loss of appetite GU: dysuria, change in color of urine, no urgency or frequency.   flank pain. MS:   joint pain, stiffness, decreased range of motion, back pain. Neuro-  nothing unusual Psych:  change in mood or affect.  depression or anxiety.   memory loss.   OBJ- Physical Exam General- Alert, Oriented, Affect-appropriate, Distress- none acute, + obese Skin- rash-none, lesions- none, excoriation- none Lymphadenopathy- none Head- atraumatic            Eyes- Gross vision intact, PERRLA, conjunctivae and secretions clear            Ears- Hearing, canals-normal            Nose- no-Septal dev, mucus, polyps-not seen, erosion, perforation             Throat- Mallampati II , mucosa  clear , drainage- none, tonsils- atrophic Neck- flexible , trachea midline, no stridor , thyroid nl, carotid no bruit Chest - symmetrical excursion , unlabored           Heart/CV- RRR , no murmur , no gallop  , no rub, nl s1 s2                           - JVD- none , edema- none, stasis changes- none, varices- none           Lung- wheeze + bilateral/unlabored, cough- none , dullness-none, rub- none           Chest wall-  Abd-  Br/ Gen/ Rectal- Not done, not indicated Extrem- cyanosis- none, clubbing, none, atrophy- none, strength- nl Neuro- grossly intact to observation

## 2021-04-13 ENCOUNTER — Ambulatory Visit (INDEPENDENT_AMBULATORY_CARE_PROVIDER_SITE_OTHER): Payer: 59 | Admitting: Internal Medicine

## 2021-04-13 ENCOUNTER — Other Ambulatory Visit: Payer: Self-pay

## 2021-04-13 ENCOUNTER — Encounter: Payer: Self-pay | Admitting: Internal Medicine

## 2021-04-13 VITALS — BP 100/70 | HR 92 | Temp 97.8°F | Ht 63.0 in | Wt 205.6 lb

## 2021-04-13 DIAGNOSIS — J4540 Moderate persistent asthma, uncomplicated: Secondary | ICD-10-CM

## 2021-04-13 DIAGNOSIS — J4541 Moderate persistent asthma with (acute) exacerbation: Secondary | ICD-10-CM

## 2021-04-13 DIAGNOSIS — J3089 Other allergic rhinitis: Secondary | ICD-10-CM

## 2021-04-13 DIAGNOSIS — J302 Other seasonal allergic rhinitis: Secondary | ICD-10-CM | POA: Diagnosis not present

## 2021-04-13 MED ORDER — IPRATROPIUM-ALBUTEROL 0.5-2.5 (3) MG/3ML IN SOLN: RESPIRATORY_TRACT | 12 refills | Status: DC

## 2021-04-13 NOTE — Assessment & Plan Note (Signed)
Persistent wheezing.  Along with history nasal polyps, it may be appropriate to change from Nucala to something else-Dupixent or Tezpire.  We will consider that after we see how she does when we add nebulizer machine/DuoNeb. Plan-DuoNeb/nebulizer once or twice daily, schedule PFT

## 2021-04-13 NOTE — Patient Instructions (Addendum)
Try using your Flonase 2 puffs each nostril twice daily  Script sent to add Duoneb nebulizer solution.  Try a treatment once or twice daily in addition to your usual meds.  We may want to consider changing from [place]. We will discuss this next visit.  PFT ordered

## 2021-04-13 NOTE — Assessment & Plan Note (Signed)
Unclear if diminished taste and smell reflect previous COVID infection or her nasal polyps/rhinitis Plan-try increasing Flonase to 2 puffs twice daily as discussed

## 2021-04-23 ENCOUNTER — Other Ambulatory Visit: Payer: Self-pay

## 2021-04-23 ENCOUNTER — Ambulatory Visit (INDEPENDENT_AMBULATORY_CARE_PROVIDER_SITE_OTHER): Payer: 59 | Admitting: Obstetrics & Gynecology

## 2021-04-23 ENCOUNTER — Encounter (HOSPITAL_BASED_OUTPATIENT_CLINIC_OR_DEPARTMENT_OTHER): Payer: Self-pay | Admitting: Obstetrics & Gynecology

## 2021-04-23 VITALS — BP 97/63 | HR 88 | Ht 63.0 in | Wt 202.6 lb

## 2021-04-23 DIAGNOSIS — M8580 Other specified disorders of bone density and structure, unspecified site: Secondary | ICD-10-CM

## 2021-04-23 DIAGNOSIS — Z0000 Encounter for general adult medical examination without abnormal findings: Secondary | ICD-10-CM

## 2021-04-23 DIAGNOSIS — R7303 Prediabetes: Secondary | ICD-10-CM

## 2021-04-23 DIAGNOSIS — Z780 Asymptomatic menopausal state: Secondary | ICD-10-CM | POA: Diagnosis not present

## 2021-04-23 DIAGNOSIS — Z01419 Encounter for gynecological examination (general) (routine) without abnormal findings: Secondary | ICD-10-CM

## 2021-04-23 LAB — LIPID PANEL
Chol/HDL Ratio: 3 ratio (ref 0.0–4.4)
Cholesterol, Total: 192 mg/dL (ref 100–199)
HDL: 63 mg/dL (ref >39)
LDL Chol Calc (NIH): 117 mg/dL — ABNORMAL HIGH (ref 0–99)
Triglycerides: 67 mg/dL (ref 0–149)
VLDL Cholesterol Cal: 12 mg/dL (ref 5–40)

## 2021-04-23 NOTE — Progress Notes (Signed)
60 y.o. G2P2 [name] here for annual exam.  Doing well but is having some issues with asthma.  She is seeing Dr. Annamaria Boots.  Having pulmonary function test scheduled.  On Saxenda and tracking food.  Has not been able to increase dosage due to nausea.    Patient's last menstrual period was 11/14/2011 (approximate).          Sexually active: Yes.    The current method of family planning is BTL Exercising: active Smoker:  no  Health Maintenance: Pap:  01/03/2020 Negative History of abnormal Pap:  no MMG:  02/24/2021 Negative Colonoscopy:  02/05/2015, Dr. Earlean Shawl, follow up 10 years BMD:   04/18/2019 Screening Labs: ordered   reports that she has never smoked. She has never used smokeless tobacco. She reports that she does not currently use alcohol. She reports that she does not use drugs.  Past Medical History:  Diagnosis Date   Allergic rhinitis, cause unspecified    Anemia    Anxiety and depression    with previous medication use   Asthma    Rhinosinusitis     Past Surgical History:  Procedure Laterality Date   BUNIONECTOMY     left   CESAREAN SECTION     x2   CHOLECYSTECTOMY     HEMORRHOID BANDING     left radius fracture     TONSILLECTOMY     TUBAL LIGATION      Current Outpatient Medications  Medication Sig Dispense Refill   Ascorbic Acid (VITA-C PO) Take by mouth.     Biotin 10 MG CAPS Take by mouth.     BREO ELLIPTA 200-25 MCG/INH AEPB INHALE 1 PUFF INTO THE LUNGS DAILY. 180 each 2   Calcium Carbonate (CALCIUM 500 PO) Take by mouth.     CALCIUM PO Take by mouth.     Cyanocobalamin (VITAMIN B-12) 2500 MCG SUBL Place 1 tablet under the tongue as needed.      EPIPEN 2-PAK 0.3 MG/0.3ML SOAJ injection See admin instructions.  1   fexofenadine (ALLEGRA) 180 MG tablet Take 1 tablet by mouth daily as needed.     ipratropium-albuterol (DUONEB) 0.5-2.5 (3) MG/3ML SOLN Use neb every 6 hours if needed 75 mL 12   Liraglutide -Weight Management  (SAXENDA) 18 MG/3ML SOPN Inject 1.2 mg into the skin daily.     Mepolizumab 100 MG/ML SOAJ Inject into the skin.     montelukast (SINGULAIR) 10 MG tablet TAKE 1 TABLET (10 MG TOTAL) BY MOUTH AT BEDTIME. 90 tablet 1   omeprazole (PRILOSEC) 40 MG capsule Take by mouth.     PROAIR HFA 108 (90 Base) MCG/ACT inhaler USE 2 PUFFS EVERY 4 TO 6 HOURS AS NEEDED (RESCUE) 8.5 Inhaler 0   TURMERIC PO Take by mouth.     UNABLE TO FIND 2 shots a week allergy     Vitamin D, Ergocalciferol, (DRISDOL) 1.25 MG (50000 UNIT) CAPS capsule Take 1 capsule (50,000 Units total) by mouth every 7 (seven) days. 12 capsule 4   No current facility-administered medications for this visit.    Family History  Problem Relation Age of Onset   Lupus Maternal Grandmother    ALS Father    Osteopenia Father    Emphysema Mother    Cancer Mother        lung   Hypertension Mother    Heart disease Mother    Hypertension Brother    Other Brother        born  with one kidney   Hypertension Brother        2   Hypertension Sister    Breast cancer Maternal Aunt     Review of Systems  All other systems reviewed and are negative.  Exam:   BP 97/63   Pulse 88   Ht 5\' 3"  (1.6 m)   Wt 202 lb 9.6 oz (91.9 kg)   LMP 11/14/2011 (Approximate)   BMI 35.89 kg/m   Height: 5\' 3"  (160 cm)  General appearance: alert, cooperative and appears stated age Head: Normocephalic, without obvious abnormality, atraumatic Neck: no adenopathy, supple, symmetrical, trachea midline and thyroid normal to inspection and palpation Lungs: clear to auscultation bilaterally Breasts: normal appearance, no masses or tenderness Heart: regular rate and rhythm Abdomen: soft, non-tender; bowel sounds normal; no masses,  no organomegaly Extremities: extremities normal, atraumatic, no cyanosis or edema Skin: Skin color, texture, turgor normal. No rashes or lesions Lymph nodes: Cervical, supraclavicular, and axillary nodes normal. No abnormal inguinal  nodes palpated Neurologic: Grossly normal   Pelvic: External genitalia:  no lesions              Urethra:  normal appearing urethra with no masses, tenderness or lesions              Bartholins and Skenes: normal                 Vagina: normal appearing vagina with normal color and no discharge, no lesions              Cervix: no lesions              Pap taken: No. Bimanual Exam:  Uterus:  normal size, contour, position, consistency, mobility, non-tender              Adnexa: no mass, fullness, tenderness               Rectovaginal: Confirms               Anus:  normal sphincter tone, no lesions  Chaperone, Octaviano Batty, CMA, was present for exam.  Assessment/Plan: 1. Well woman exam with routine gynecological exam - pap neg with neg HR HVP 12/2019 - MMG up to date - colonoscopy 2016. Follow up 10 years. - vaccines reviewed/updated  2. Postmenopausal - No HRT  3. Osteopenia, unspecified location - DG BONE DENSITY (DXA); Future  4. Prediabetes  5. Blood tests for routine general physical examination - CBC - Comprehensive metabolic panel - Hemoglobin A1c - VITAMIN D 25 Hydroxy (Vit-D Deficiency, Fractures)

## 2021-04-23 NOTE — Addendum Note (Signed)
Addended by: Megan Salon on: 04/23/2021 10:42 AM   Modules accepted: Orders

## 2021-04-24 LAB — CBC
Hematocrit: 44.1 % (ref 34.0–46.6)
Hemoglobin: 14.6 g/dL (ref 11.1–15.9)
MCH: 29.4 pg (ref 26.6–33.0)
MCHC: 33.1 g/dL (ref 31.5–35.7)
MCV: 89 fL (ref 79–97)
Platelets: 256 10*3/uL (ref 150–450)
RBC: 4.97 x10E6/uL (ref 3.77–5.28)
RDW: 13.1 % (ref 11.7–15.4)
WBC: 5.2 10*3/uL (ref 3.4–10.8)

## 2021-04-24 LAB — COMPREHENSIVE METABOLIC PANEL
ALT: 22 IU/L (ref 0–32)
AST: 27 IU/L (ref 0–40)
Albumin/Globulin Ratio: 1.9 (ref 1.2–2.2)
Albumin: 4.3 g/dL (ref 3.8–4.9)
Alkaline Phosphatase: 88 IU/L (ref 44–121)
BUN/Creatinine Ratio: 18 (ref 12–28)
BUN: 14 mg/dL (ref 8–27)
Bilirubin Total: 0.3 mg/dL (ref 0.0–1.2)
CO2: 23 mmol/L (ref 20–29)
Calcium: 9 mg/dL (ref 8.7–10.3)
Chloride: 105 mmol/L (ref 96–106)
Creatinine, Ser: 0.78 mg/dL (ref 0.57–1.00)
Globulin, Total: 2.3 g/dL (ref 1.5–4.5)
Glucose: 85 mg/dL (ref 70–99)
Potassium: 4.2 mmol/L (ref 3.5–5.2)
Sodium: 140 mmol/L (ref 134–144)
Total Protein: 6.6 g/dL (ref 6.0–8.5)
eGFR: 87 mL/min/{1.73_m2} (ref >59)

## 2021-04-24 LAB — HEMOGLOBIN A1C
Est. average glucose Bld gHb Est-mCnc: 114 mg/dL
Hgb A1c MFr Bld: 5.6 % (ref 4.8–5.6)

## 2021-04-24 LAB — VITAMIN D 25 HYDROXY (VIT D DEFICIENCY, FRACTURES): Vit D, 25-Hydroxy: 45.4 ng/mL (ref 30.0–100.0)

## 2021-05-18 ENCOUNTER — Other Ambulatory Visit: Payer: Self-pay | Admitting: Obstetrics & Gynecology

## 2021-05-18 DIAGNOSIS — Z1231 Encounter for screening mammogram for malignant neoplasm of breast: Secondary | ICD-10-CM

## 2021-05-20 ENCOUNTER — Telehealth: Payer: Self-pay | Admitting: Internal Medicine

## 2021-05-20 NOTE — Telephone Encounter (Signed)
Will need to call tomorrow to see which CXR they are requesting.

## 2021-06-21 ENCOUNTER — Other Ambulatory Visit: Payer: Self-pay

## 2021-06-21 ENCOUNTER — Ambulatory Visit (INDEPENDENT_AMBULATORY_CARE_PROVIDER_SITE_OTHER): Payer: 59 | Admitting: Internal Medicine

## 2021-06-21 DIAGNOSIS — J4540 Moderate persistent asthma, uncomplicated: Secondary | ICD-10-CM

## 2021-06-21 LAB — PULMONARY FUNCTION TEST
DL/VA % pred: 115 %
DL/VA: 4.87 ml/min/mmHg/L
DLCO unc % pred: 92 %
DLCO unc: 18.3 ml/min/mmHg
FEF 25-75 Post: 2.81 L/sec
FEF 25-75 Pre: 1.83 L/sec
FEF2575-%Change-Post: 53 %
FEF2575-%Pred-Post: 120 %
FEF2575-%Pred-Pre: 78 %
FEV1-%Change-Post: 12 %
FEV1-%Pred-Post: 78 %
FEV1-%Pred-Pre: 69 %
FEV1-Post: 1.96 L
FEV1-Pre: 1.74 L
FEV1FVC-%Change-Post: 2 %
FEV1FVC-%Pred-Pre: 104 %
FEV6-%Change-Post: 8 %
FEV6-%Pred-Post: 73 %
FEV6-%Pred-Pre: 68 %
FEV6-Post: 2.31 L
FEV6-Pre: 2.13 L
FEV6FVC-%Change-Post: 0 %
FEV6FVC-%Pred-Post: 102 %
FEV6FVC-%Pred-Pre: 103 %
FVC-%Change-Post: 9 %
FVC-%Pred-Post: 72 %
FVC-%Pred-Pre: 65 %
FVC-Post: 2.34 L
FVC-Pre: 2.14 L
Post FEV1/FVC ratio: 84 %
Post FEV6/FVC ratio: 99 %
Pre FEV1/FVC ratio: 81 %
Pre FEV6/FVC Ratio: 100 %
RV % pred: 110 %
RV: 2.16 L
TLC % pred: 88 %
TLC: 4.42 L

## 2021-06-21 NOTE — Progress Notes (Signed)
PFT done today. 

## 2021-08-16 ENCOUNTER — Ambulatory Visit: Payer: 59 | Admitting: Internal Medicine

## 2021-09-08 NOTE — Progress Notes (Deleted)
Patient ID: Bryan Lemma, female    DOB: 1961-04-07, 61 y.o.   MRN: [id number]  HPI F never smoker followed for asthma, allergic rhinitis PFT: 01/28/2010 within normal limits with insignificant response to bronchodilator. Mild decrease in diffusion capacity. FVC 2.70/81%, FEV1 2.22/87%, FEV1/FEC 0.82. TLC 90%, DLCO 75%. CT max fac 10/20/14- IMPRESSION: Diffuse sinus disease changes as above Allergy profile 10/20/14- Total IgE 263, elevated grass pollens  CBC- 10/20/14  EOS absolute 0.8 (H) Office Spirometry 12/21/2015-moderate restrictive and obstructive changes with reduced exhaled volume. FVC 1.73/51%, FEV1 1.33/50%, ratio 77%, FEF 25-75 1.10/43% FENO 04/21/16- 28, she reports score 15 at her allergy office. ---------------------------------   04/13/21- 61 year old female never smoker followed for Dyspnea post Covid, Asthma/ (Nucala since 4/0/08), complicated by allergic rhinitis(Dr Orvil Feil), chronic rhinosinusitis, Obesity, Depression,  -  Nucala, Proair hfa, Breo 200/ , Allegra 180, Singulair, Epipen, Covid infection Sept 2021, June 2022,  Covid vax- 2 Phizer Flu vax-had -----Patient feels good overall states that she still has some wheeze that comes through. Also states that she is mostly using rescue inhaler when she is walking/exercising.  ACT score 20 She had thought wheezing and dyspnea on exertion increased after COVID infection but admits it may have been coincidental that she noticed.  Trelegy was no better than Breo.  Insurance will not cover Truett Perna so she is going to try increasing Flonase from 1 to 2 puffs twice daily.  When she first started Nucala it significantly improved her sense of taste and smell/polyps as well as asthma control.  We discussed possible change. CXR 12/11/20- No active cardiopulmonary disease.  09/10/21- 61 year old female never smoker followed for Dyspnea post Covid, Asthma/ (Nucala since 10/21/59), complicated by allergic rhinitis(Dr Orvil Feil), chronic  rhinosinusitis, Obesity, Depression,  -  Nucala, Proair hfa, Breo 200/ , Allegra 180, Singulair, Epipen, Neb Duoneb, Covid infection Sept 2021, June 2022,  Covid vax- 2 Phizer Flu vax-had Allergist Dr Orvil Feil PFT 06/21/21-Minimal obstruction with response to BD, Airtrapping, Nl DLCO    ROS-see HPI   + = positive Constitutional:    weight loss, night sweats, fevers, chills, fatigue, lassitude. HEENT:    headaches, difficulty swallowing, tooth/dental problems, sore throat,       sneezing, itching, ear ache, +nasal congestion, post nasal drip, snoring CV:    chest pain, orthopnea, PND, +swelling in lower extremities, anasarca,                                   dizziness, palpitations Resp:   +shortness of breath with exertion or at rest.                productive cough,   non-productive cough, coughing up of blood.              change in color of mucus.  wheezing.   Skin:    rash or lesions. GI:  + heartburn, indigestion, abdominal pain, nausea, vomiting, diarrhea,                 change in bowel habits, loss of appetite GU: dysuria, change in color of urine, no urgency or frequency.   flank pain. MS:   joint pain, stiffness, decreased range of motion, back pain. Neuro-     nothing unusual Psych:  change in mood or affect.  depression or anxiety.   memory loss.   OBJ- Physical Exam General- Alert, Oriented, Affect-appropriate, Distress- none acute, +  obese Skin- rash-none, lesions- none, excoriation- none Lymphadenopathy- none Head- atraumatic            Eyes- Gross vision intact, PERRLA, conjunctivae and secretions clear            Ears- Hearing, canals-normal            Nose- no-Septal dev, mucus, polyps-not seen, erosion, perforation             Throat- Mallampati II , mucosa clear , drainage- none, tonsils- atrophic Neck- flexible , trachea midline, no stridor , thyroid nl, carotid no bruit Chest - symmetrical excursion , unlabored           Heart/CV- RRR , no murmur , no gallop   , no rub, nl s1 s2                           - JVD- none , edema- none, stasis changes- none, varices- none           Lung- wheeze + bilateral/unlabored, cough- none , dullness-none, rub- none           Chest wall-  Abd-  Br/ Gen/ Rectal- Not done, not indicated Extrem- cyanosis- none, clubbing, none, atrophy- none, strength- nl Neuro- grossly intact to observation

## 2021-09-10 ENCOUNTER — Ambulatory Visit: Payer: 59 | Admitting: Internal Medicine

## 2022-02-08 IMAGING — DX DG CHEST 2V
2 series · 2 of 2 positions shown · non-contrast
Comparison: March 21, 2014.

CLINICAL DATA: Asthma, post 0OG77-MA infection.

EXAM:
CHEST - 2 VIEW

[chest pa]
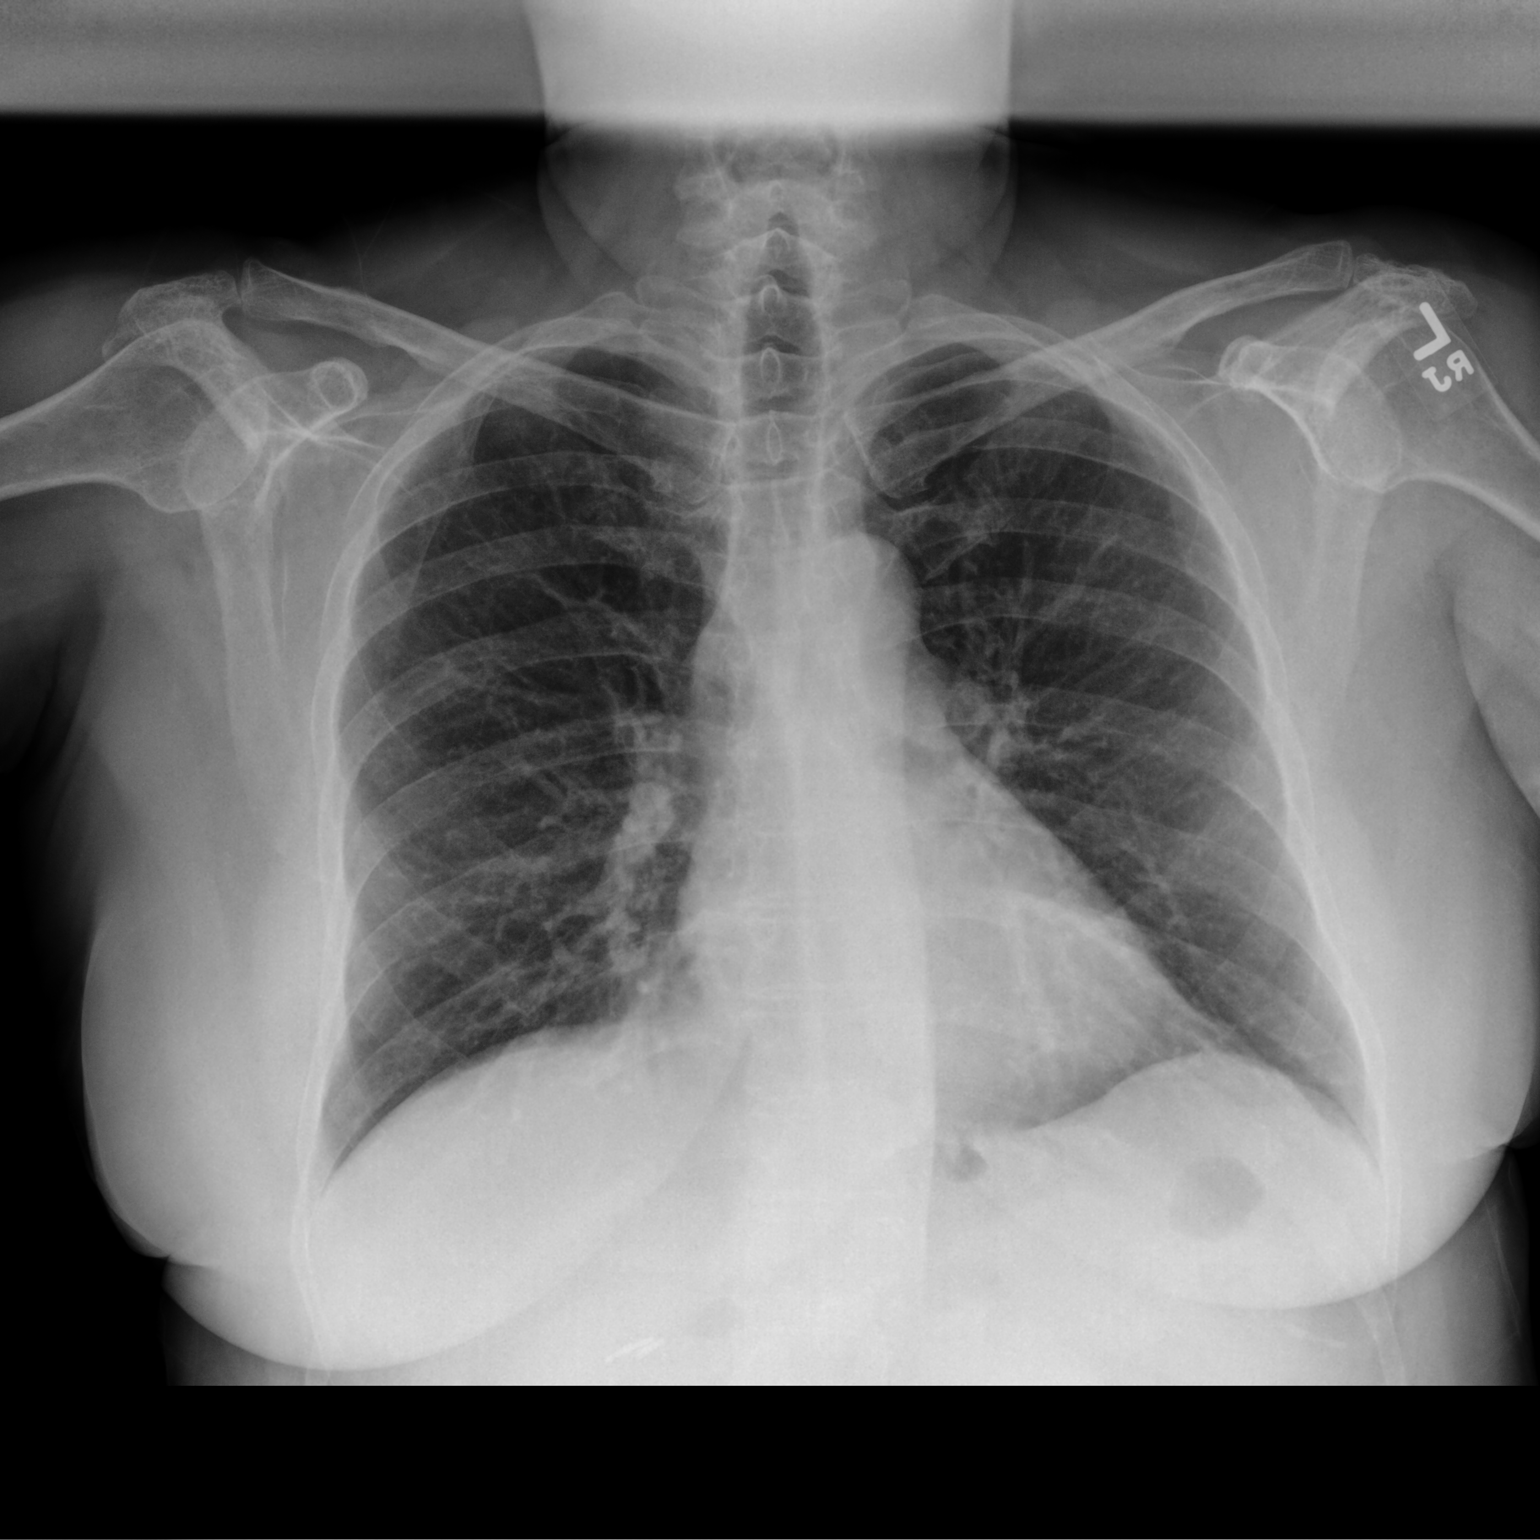

[chest lat]
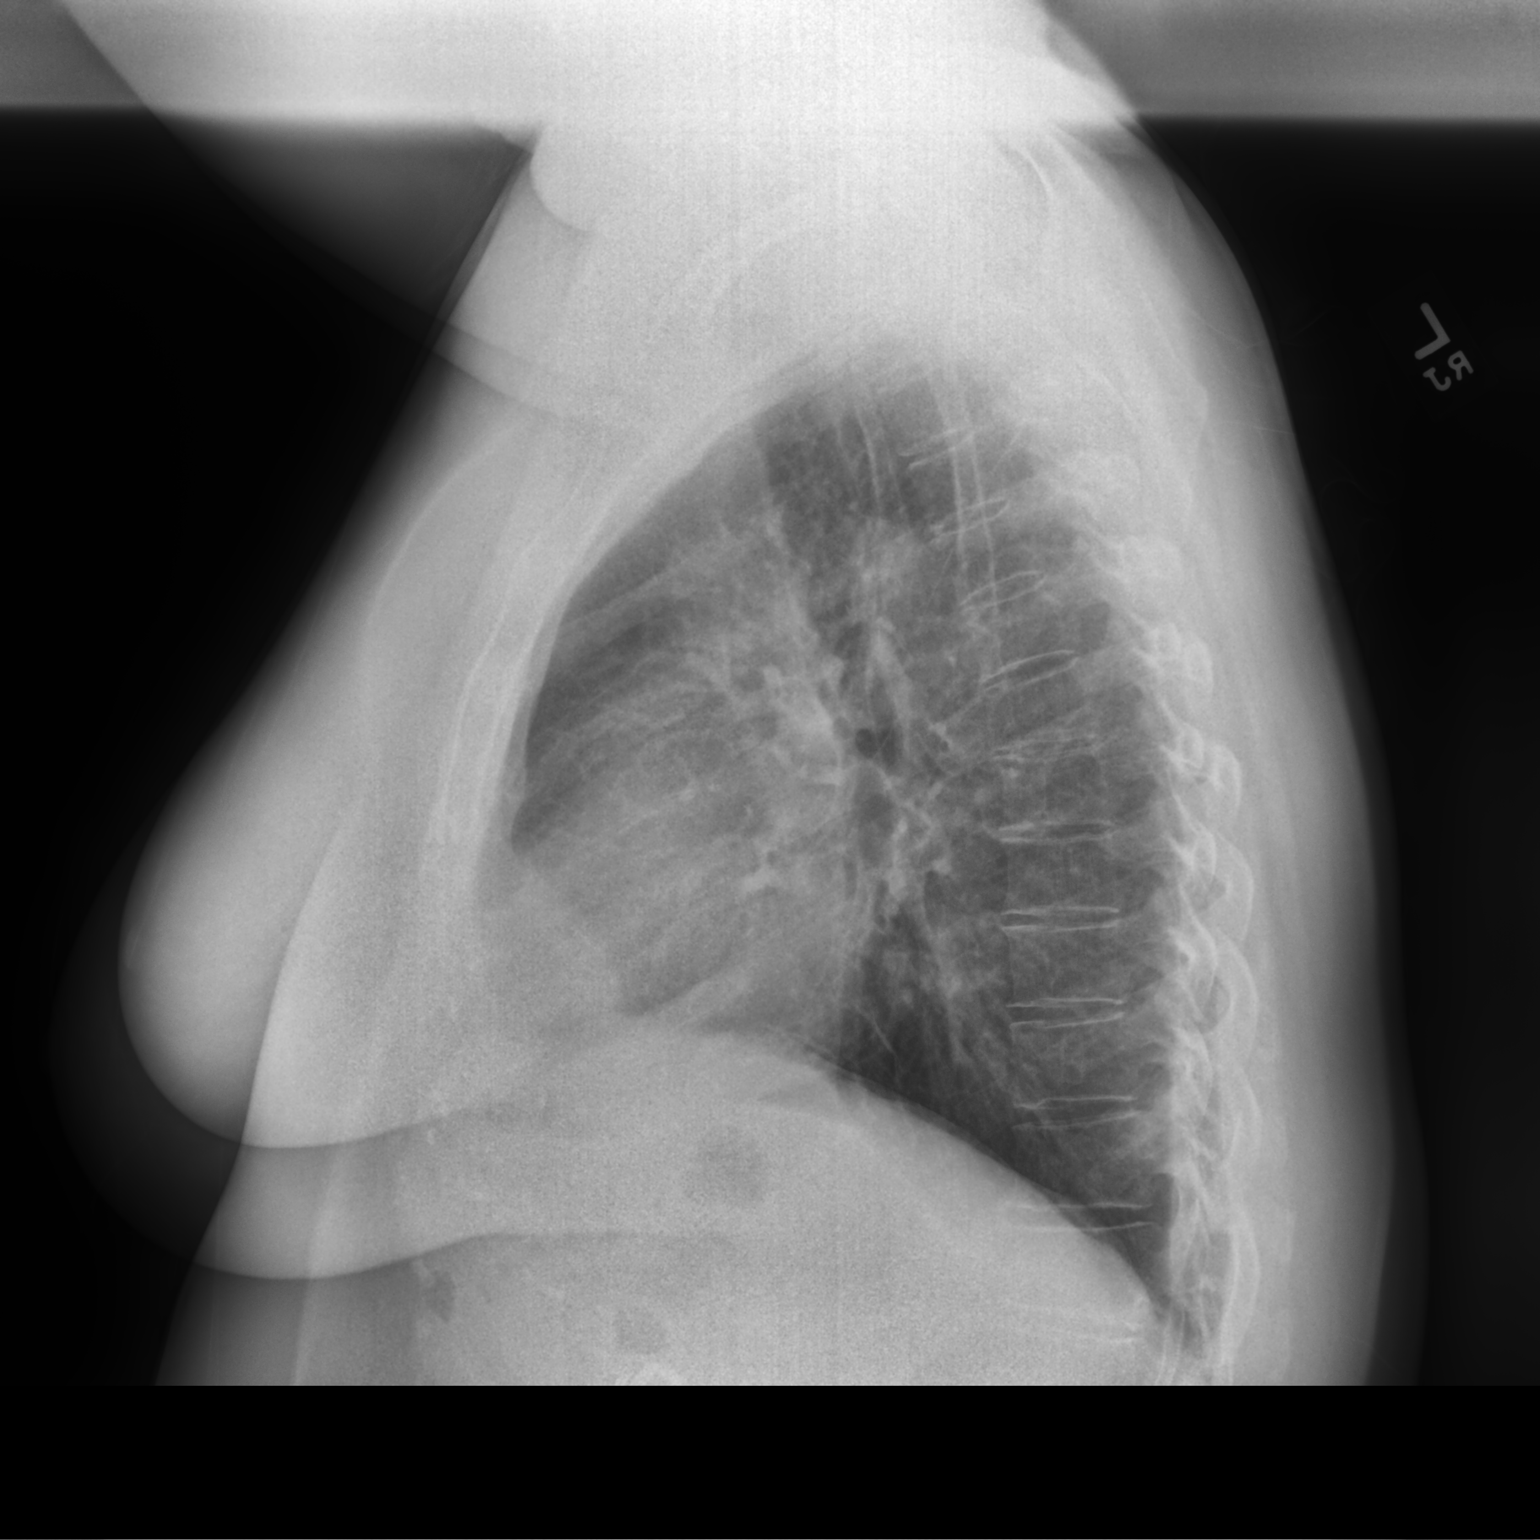

[2 of 2 positions shown; findings below may reference images not displayed]

FINDINGS: The heart size and mediastinal contours are within normal limits.
Both lungs are clear. The visualized skeletal structures are
unremarkable.
IMPRESSION: No active cardiopulmonary disease.

## 2022-03-07 ENCOUNTER — Ambulatory Visit
Admission: RE | Admit: 2022-03-07 | Discharge: 2022-03-07 | Disposition: A | Discharge status: Home / Self Care | Payer: 59 | Source: Ambulatory Visit | Attending: Obstetrics & Gynecology | Admitting: Obstetrics & Gynecology

## 2022-03-07 DIAGNOSIS — Z1231 Encounter for screening mammogram for malignant neoplasm of breast: Secondary | ICD-10-CM

## 2022-03-07 DIAGNOSIS — M8580 Other specified disorders of bone density and structure, unspecified site: Secondary | ICD-10-CM

## 2022-04-25 IMAGING — MG MM DIGITAL SCREENING BILAT W/ TOMO AND CAD
8 series · 8 of 24 positions shown · non-contrast
Comparison: Previous exam(s).

CLINICAL DATA: Screening.

EXAM:
DIGITAL SCREENING BILATERAL MAMMOGRAM WITH TOMOSYNTHESIS AND CAD
TECHNIQUE: Bilateral screening digital craniocaudal and mediolateral oblique
mammograms were obtained. Bilateral screening digital breast
tomosynthesis was performed. The images were evaluated with
computer-aided detection.

[L MLO synth-2D]
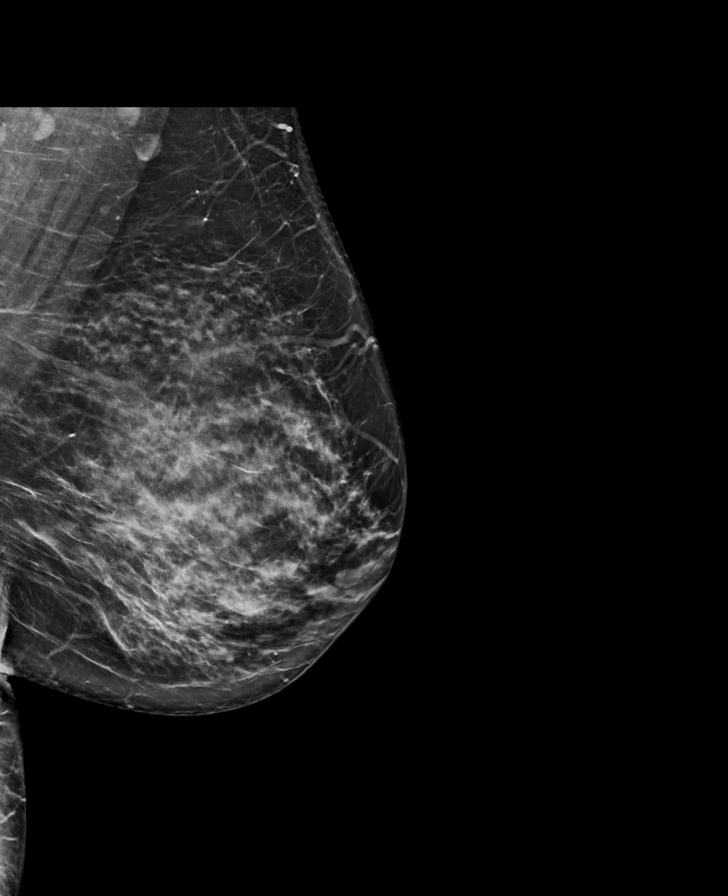

[L CC synth-2D]
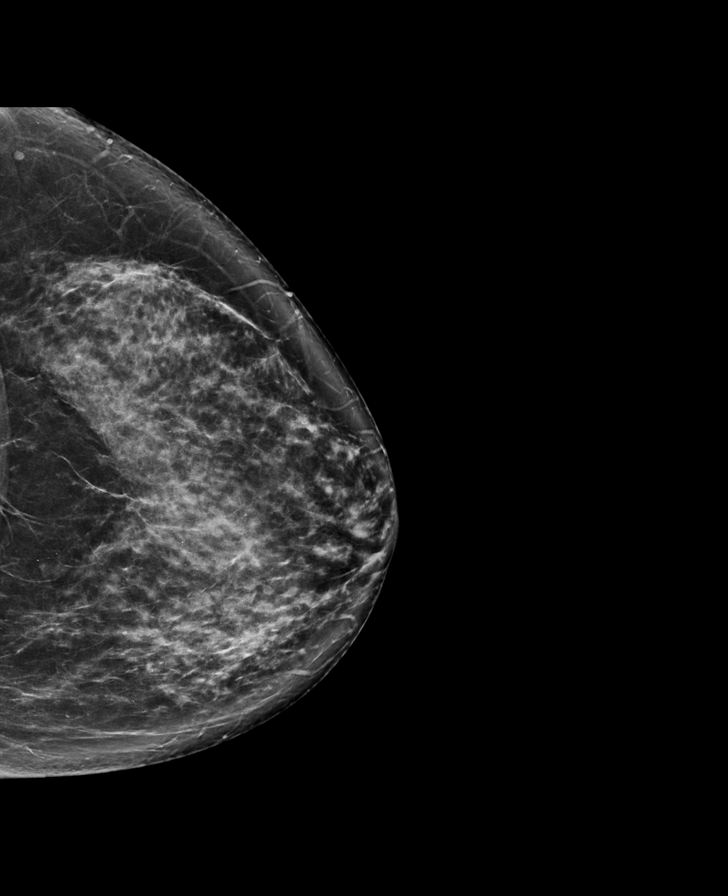

[R CC synth-2D]
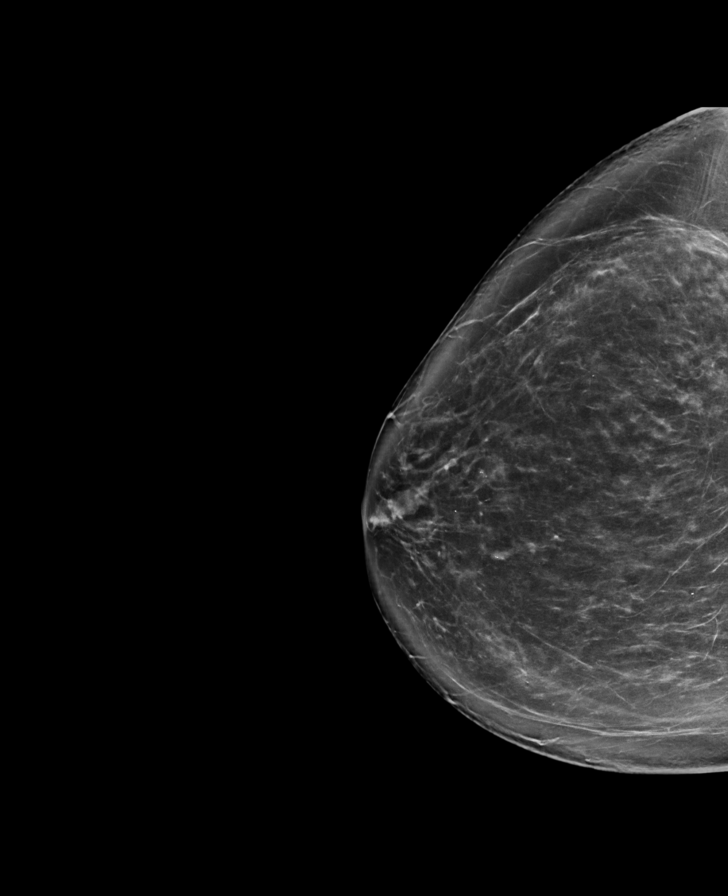

[R MLO synth-2D]
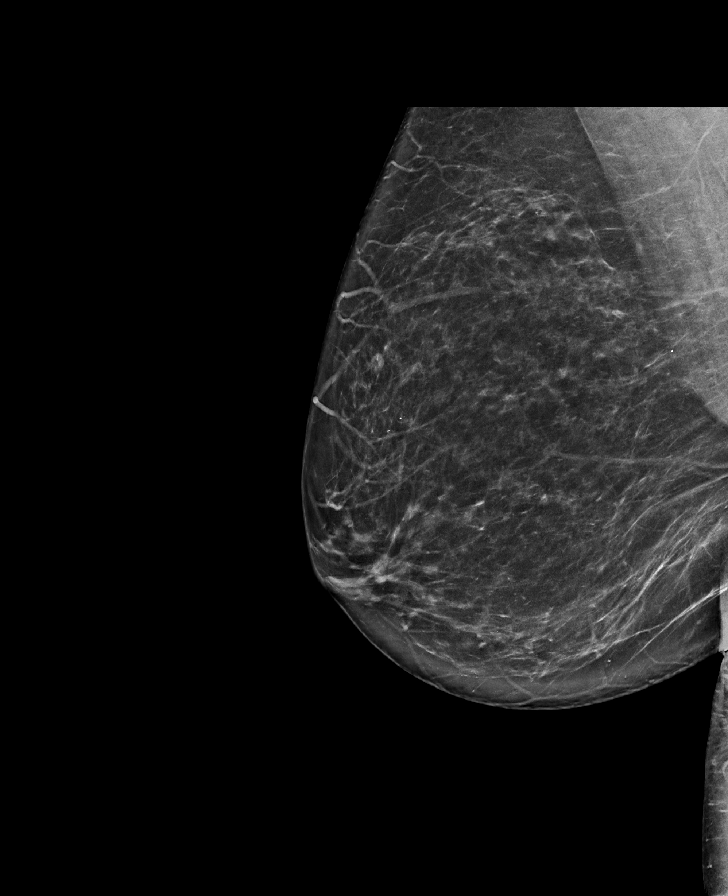

[L MLO tomo · tomo slice 44/87.0]
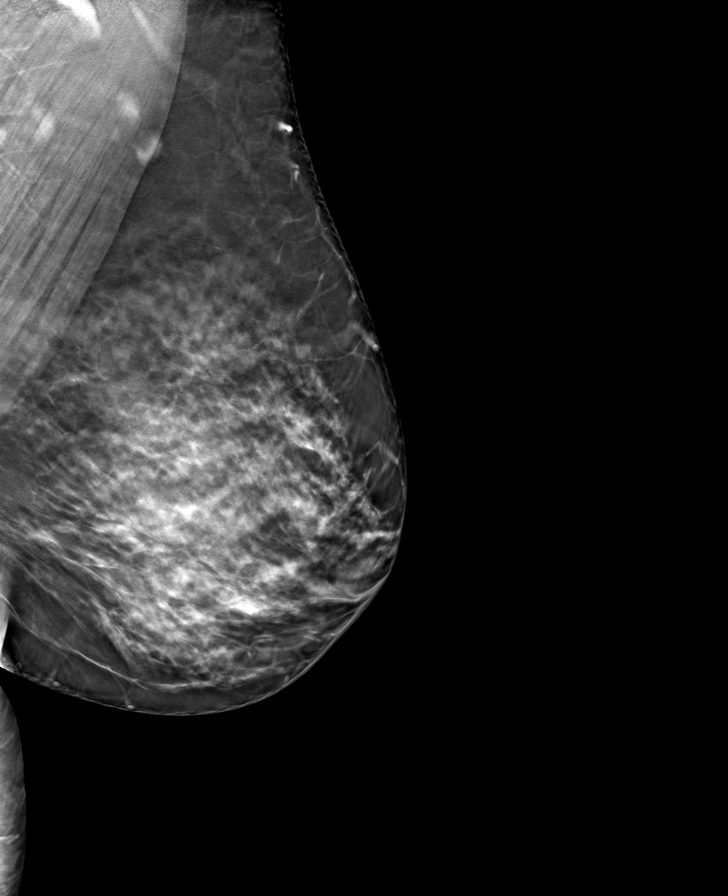

[L CC tomo · tomo slice 45/89.0]
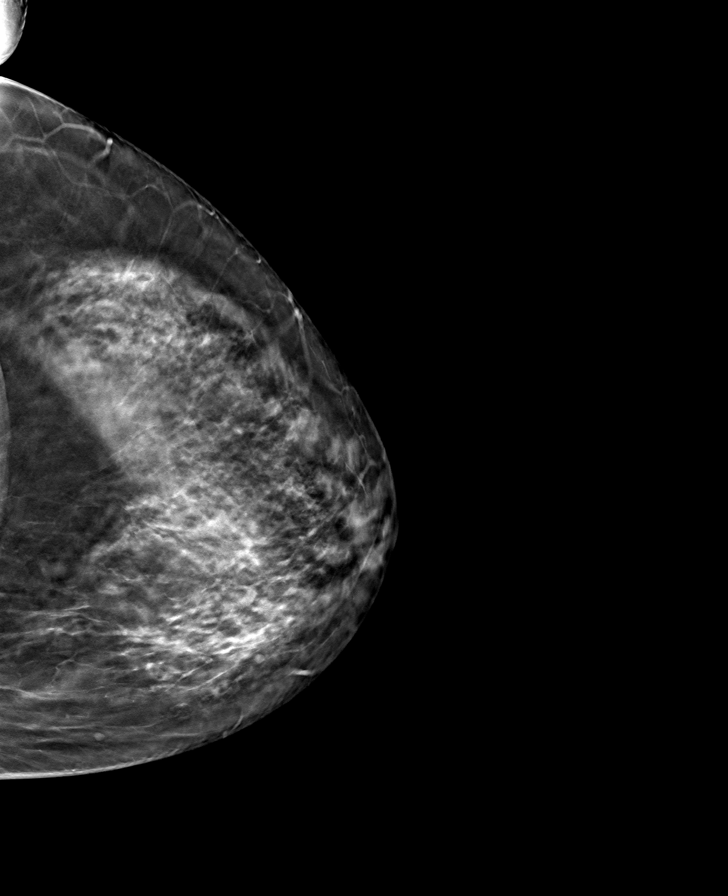

[R CC tomo · tomo slice 46/91.0]
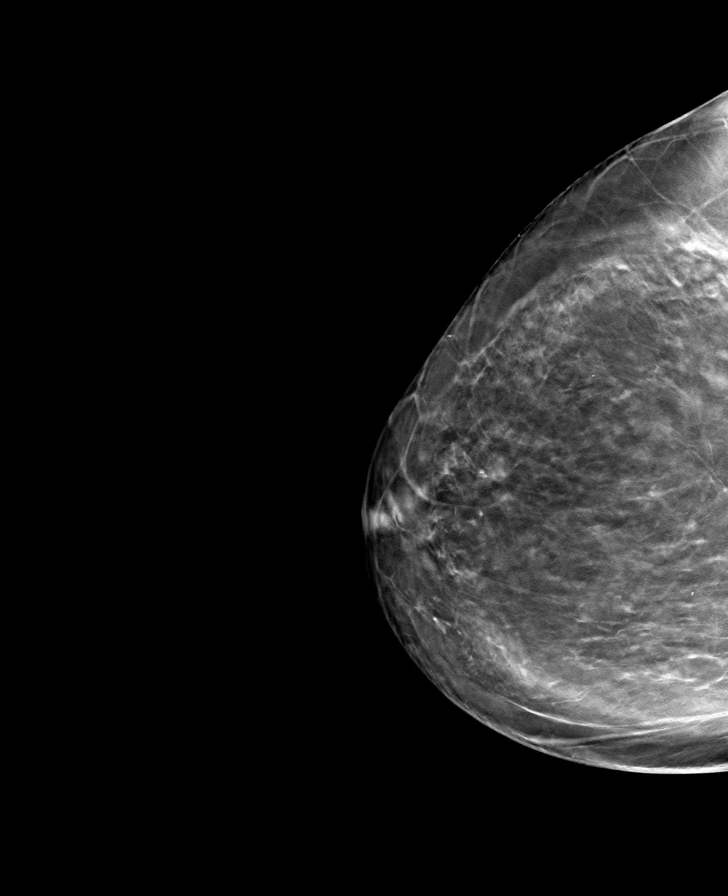

[R MLO tomo · tomo slice 45/88.0]
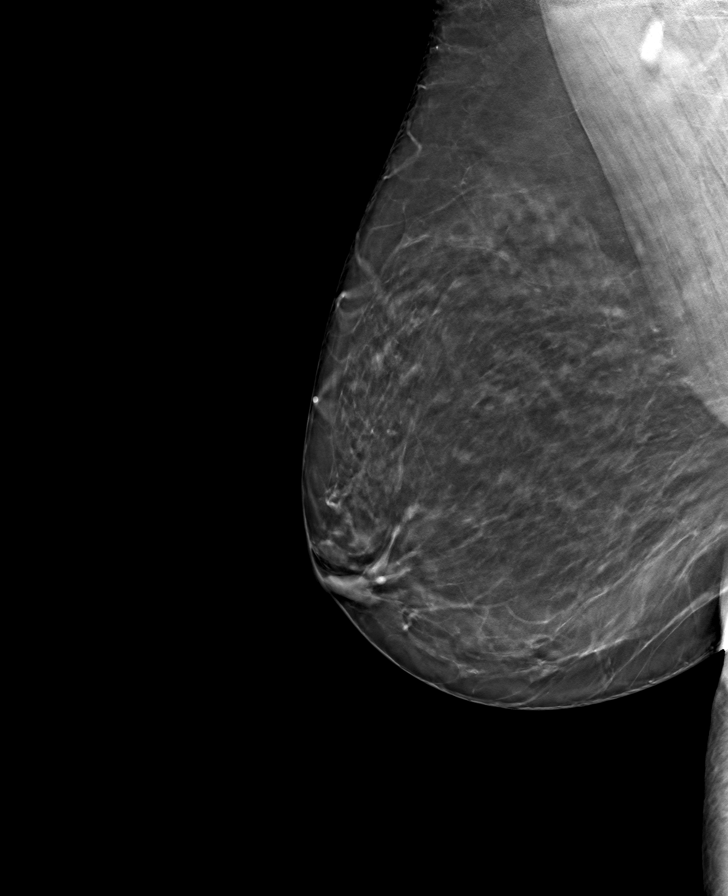

[8 of 24 positions shown; findings below may reference images not displayed]

ACR Breast Density Category c: The breast tissue is heterogeneously
dense, which may obscure small masses.
FINDINGS: There are no findings suspicious for malignancy.
IMPRESSION: No mammographic evidence of malignancy. A result letter of this
screening mammogram will be mailed directly to the patient.

RECOMMENDATION:
Screening mammogram in one year. (Code:Q3-W-BC3)

BI-RADS CATEGORY  1: Negative.

## 2022-05-02 ENCOUNTER — Ambulatory Visit (HOSPITAL_BASED_OUTPATIENT_CLINIC_OR_DEPARTMENT_OTHER): Payer: 59 | Admitting: Obstetrics & Gynecology

## 2022-07-12 ENCOUNTER — Encounter (HOSPITAL_BASED_OUTPATIENT_CLINIC_OR_DEPARTMENT_OTHER): Payer: Self-pay | Admitting: Obstetrics & Gynecology

## 2022-07-12 ENCOUNTER — Ambulatory Visit (INDEPENDENT_AMBULATORY_CARE_PROVIDER_SITE_OTHER): Payer: 59 | Admitting: Obstetrics & Gynecology

## 2022-07-12 ENCOUNTER — Other Ambulatory Visit (HOSPITAL_COMMUNITY)
Admission: RE | Admit: 2022-07-12 | Discharge: 2022-07-12 | Disposition: A | Discharge status: Home / Self Care | Payer: 59 | Source: Ambulatory Visit | Attending: Obstetrics & Gynecology | Admitting: Obstetrics & Gynecology

## 2022-07-12 VITALS — BP 121/64 | HR 69 | Ht 63.25 in | Wt 208.2 lb

## 2022-07-12 DIAGNOSIS — Z01419 Encounter for gynecological examination (general) (routine) without abnormal findings: Secondary | ICD-10-CM

## 2022-07-12 DIAGNOSIS — Z124 Encounter for screening for malignant neoplasm of cervix: Secondary | ICD-10-CM

## 2022-07-12 DIAGNOSIS — M8588 Other specified disorders of bone density and structure, other site: Secondary | ICD-10-CM | POA: Diagnosis not present

## 2022-07-12 DIAGNOSIS — E7800 Pure hypercholesterolemia, unspecified: Secondary | ICD-10-CM

## 2022-07-12 DIAGNOSIS — Z6836 Body mass index (BMI) 36.0-36.9, adult: Secondary | ICD-10-CM

## 2022-07-12 DIAGNOSIS — K219 Gastro-esophageal reflux disease without esophagitis: Secondary | ICD-10-CM | POA: Diagnosis not present

## 2022-07-12 DIAGNOSIS — E559 Vitamin D deficiency, unspecified: Secondary | ICD-10-CM

## 2022-07-12 MED ORDER — OMEPRAZOLE 40 MG PO CPDR: 40.0000 mg | DELAYED_RELEASE_CAPSULE | Freq: Every day | ORAL | 3 refills | Status: DC

## 2022-07-12 MED ORDER — VITAMIN D (ERGOCALCIFEROL) 1.25 MG (50000 UNIT) PO CAPS: 50000.0000 [IU] | ORAL_CAPSULE | ORAL | 4 refills | Status: DC

## 2022-07-12 NOTE — Progress Notes (Signed)
62 y.o. G2P2 [name] here for annual exam.  Doing well.  Denies vaginal bleeding.  Dr. Earlean Shawl retired.  Needs omeprazole refill.  Will need referral to new GI in 2026.    Patient's last menstrual period was 11/14/2011 (approximate).          Sexually active: Yes.    The current method of family planning is tubal ligation.    Smoker:  no  Health Maintenance: Pap:  01/03/2020 Negative History of abnormal Pap:  no MMG:  03/07/2022 Negative Colonoscopy:  02/05/2015, follow up 10 years BMD:   03/07/2022.  Osteopenia present. Screening Labs: ordered today   reports that she has never smoked. She has never used smokeless tobacco. She reports that she does not currently use alcohol. She reports that she does not use drugs.  Past Medical History:  Diagnosis Date   Allergic rhinitis, cause unspecified    Anemia    Anxiety and depression    with previous medication use   Asthma    Rhinosinusitis     Past Surgical History:  Procedure Laterality Date   BUNIONECTOMY     left   CESAREAN SECTION     x2   CHOLECYSTECTOMY     HEMORRHOID BANDING     left radius fracture     TONSILLECTOMY     TUBAL LIGATION      Current Outpatient Medications  Medication Sig Dispense Refill   Ascorbic Acid (VITA-C PO) Take by mouth.     Biotin 10 MG CAPS Take by mouth.     BREO ELLIPTA 200-25 MCG/INH AEPB INHALE 1 PUFF INTO THE LUNGS DAILY. 180 each 2   Calcium Carbonate (CALCIUM 500 PO) Take by mouth.     CALCIUM PO Take by mouth.     Cyanocobalamin (VITAMIN B-12) 2500 MCG SUBL Place 1 tablet under the tongue as needed.      Dupilumab (DUPIXENT) 300 MG/2ML SOPN Inject into the skin.     EPIPEN 2-PAK 0.3 MG/0.3ML SOAJ injection See admin instructions.  1   fexofenadine (ALLEGRA) 180 MG tablet Take 1 tablet by mouth daily as needed.     ipratropium-albuterol (DUONEB) 0.5-2.5 (3) MG/3ML SOLN Use neb every 6 hours if needed 75 mL 12   montelukast (SINGULAIR) 10 MG tablet TAKE 1  TABLET (10 MG TOTAL) BY MOUTH AT BEDTIME. 90 tablet 1   PROAIR HFA 108 (90 Base) MCG/ACT inhaler USE 2 PUFFS EVERY 4 TO 6 HOURS AS NEEDED (RESCUE) 8.5 Inhaler 0   UNABLE TO FIND 2 shots a week allergy     omeprazole (PRILOSEC) 40 MG capsule Take 1 capsule (40 mg total) by mouth daily. 90 capsule 3   Vitamin D, Ergocalciferol, (DRISDOL) 1.25 MG (50000 UNIT) CAPS capsule Take 1 capsule (50,000 Units total) by mouth every 7 (seven) days. 12 capsule 4   No current facility-administered medications for this visit.    Family History  Problem Relation Age of Onset   Lupus Maternal Grandmother    ALS Father    Osteopenia Father    Emphysema Mother    Cancer Mother        lung   Hypertension Mother    Heart disease Mother    Hypertension Brother    Other Brother        born with one kidney   Hypertension Brother        2   Hypertension Sister    Breast cancer Maternal Aunt  ROS: Constitutional: negative Genitourinary:negative  Exam:   BP 121/64 (BP Location: Left Arm, Patient Position: Sitting, Cuff Size: Large)   Pulse 69   Ht 5' 3.25" (1.607 m) Comment: reported  Wt 208 lb 3.2 oz (94.4 kg)   LMP 11/14/2011 (Approximate)   BMI 36.59 kg/m   Height: 5' 3.25" (160.7 cm) (reported)  General appearance: alert, cooperative and appears stated age Head: Normocephalic, without obvious abnormality, atraumatic Neck: no adenopathy, supple, symmetrical, trachea midline and thyroid normal to inspection and palpation Lungs: clear to auscultation bilaterally Breasts: normal appearance, no masses or tenderness Heart: regular rate and rhythm Abdomen: soft, non-tender; bowel sounds normal; no masses,  no organomegaly Extremities: extremities normal, atraumatic, no cyanosis or edema Skin: Skin color, texture, turgor normal. No rashes or lesions Lymph nodes: Cervical, supraclavicular, and axillary nodes normal. No abnormal inguinal nodes palpated Neurologic: Grossly normal   Pelvic:  External genitalia:  no lesions              Urethra:  normal appearing urethra with no masses, tenderness or lesions              Bartholins and Skenes: normal                 Vagina: normal appearing vagina with normal color and no discharge, no lesions              Cervix: no lesions              Pap taken: Yes.   Bimanual Exam:  Uterus:  normal size, contour, position, consistency, mobility, non-tender              Adnexa: normal adnexa and no mass, fullness, tenderness               Rectovaginal: Confirms               Anus:  normal sphincter tone, no lesions  Chaperone, Octaviano Batty, CMA, was present for exam.  Assessment/Plan: 1. Well woman exam with routine gynecological exam - Pap smear obtained today - Mammogram 02/2022 - Colonoscopy 2016 - Bone mineral density 2023 - lab work ordered - vaccines reviewed/updated.  2. Cervical cancer screening - Cytology - PAP( )  3. Gastroesophageal reflux disease without esophagitis - omeprazole (PRILOSEC) 40 MG capsule; Take 1 capsule (40 mg total) by mouth daily.  Dispense: 90 capsule; Refill: 3  4. Osteopenia of lumbar spine - will repeat in 2-3 years  5. Elevated LDL cholesterol level - Comprehensive metabolic panel - Hemoglobin A1c - TSH - Lipid panel  6. BMI 36.0-36.9,adult - Comprehensive metabolic panel - Hemoglobin A1c - TSH - Lipid panel  7. Vitamin D deficiency - VITAMIN D 25 Hydroxy (Vit-D Deficiency, Fractures) - Vitamin D, Ergocalciferol, (DRISDOL) 1.25 MG (50000 UNIT) CAPS capsule; Take 1 capsule (50,000 Units total) by mouth every 7 (seven) days.  Dispense: 12 capsule; Refill: 4

## 2022-07-13 LAB — LIPID PANEL
Chol/HDL Ratio: 3 ratio (ref 0.0–4.4)
Cholesterol, Total: 230 mg/dL — ABNORMAL HIGH (ref 100–199)
HDL: 77 mg/dL (ref >39)
LDL Chol Calc (NIH): 135 mg/dL — ABNORMAL HIGH (ref 0–99)
Triglycerides: 105 mg/dL (ref 0–149)
VLDL Cholesterol Cal: 18 mg/dL (ref 5–40)

## 2022-07-13 LAB — COMPREHENSIVE METABOLIC PANEL
ALT: 23 IU/L (ref 0–32)
AST: 26 IU/L (ref 0–40)
Albumin/Globulin Ratio: 1.5 (ref 1.2–2.2)
Albumin: 4 g/dL (ref 3.9–4.9)
Alkaline Phosphatase: 109 IU/L (ref 44–121)
BUN/Creatinine Ratio: 13 (ref 12–28)
BUN: 12 mg/dL (ref 8–27)
Bilirubin Total: 0.4 mg/dL (ref 0.0–1.2)
CO2: 22 mmol/L (ref 20–29)
Calcium: 8.9 mg/dL (ref 8.7–10.3)
Chloride: 108 mmol/L — ABNORMAL HIGH (ref 96–106)
Creatinine, Ser: 0.93 mg/dL (ref 0.57–1.00)
Globulin, Total: 2.7 g/dL (ref 1.5–4.5)
Glucose: 94 mg/dL (ref 70–99)
Potassium: 4.3 mmol/L (ref 3.5–5.2)
Sodium: 144 mmol/L (ref 134–144)
Total Protein: 6.7 g/dL (ref 6.0–8.5)
eGFR: 70 mL/min/{1.73_m2} (ref >59)

## 2022-07-13 LAB — VITAMIN D 25 HYDROXY (VIT D DEFICIENCY, FRACTURES): Vit D, 25-Hydroxy: 31 ng/mL (ref 30.0–100.0)

## 2022-07-13 LAB — HEMOGLOBIN A1C
Est. average glucose Bld gHb Est-mCnc: 114 mg/dL
Hgb A1c MFr Bld: 5.6 % (ref 4.8–5.6)

## 2022-07-13 LAB — TSH: TSH: 3.2 u[IU]/mL (ref 0.450–4.500)

## 2022-07-14 LAB — CYTOLOGY - PAP
Comment: NEGATIVE
Diagnosis: NEGATIVE
High risk HPV: NEGATIVE

## 2022-07-20 ENCOUNTER — Encounter (HOSPITAL_BASED_OUTPATIENT_CLINIC_OR_DEPARTMENT_OTHER): Payer: Self-pay | Admitting: Obstetrics & Gynecology

## 2023-07-27 ENCOUNTER — Ambulatory Visit (HOSPITAL_BASED_OUTPATIENT_CLINIC_OR_DEPARTMENT_OTHER): Payer: 59 | Admitting: Obstetrics & Gynecology

## 2023-07-27 ENCOUNTER — Encounter (HOSPITAL_BASED_OUTPATIENT_CLINIC_OR_DEPARTMENT_OTHER): Payer: Self-pay | Admitting: Obstetrics & Gynecology

## 2023-07-27 VITALS — BP 110/72 | HR 73 | Ht 63.0 in | Wt 203.2 lb

## 2023-07-27 DIAGNOSIS — Z01419 Encounter for gynecological examination (general) (routine) without abnormal findings: Secondary | ICD-10-CM | POA: Diagnosis not present

## 2023-07-27 DIAGNOSIS — L659 Nonscarring hair loss, unspecified: Secondary | ICD-10-CM

## 2023-07-27 DIAGNOSIS — M8588 Other specified disorders of bone density and structure, other site: Secondary | ICD-10-CM | POA: Diagnosis not present

## 2023-07-27 DIAGNOSIS — E559 Vitamin D deficiency, unspecified: Secondary | ICD-10-CM | POA: Diagnosis not present

## 2023-07-27 MED ORDER — VITAMIN D (ERGOCALCIFEROL) 1.25 MG (50000 UNIT) PO CAPS: 50000.0000 [IU] | ORAL_CAPSULE | ORAL | 4 refills | Status: AC

## 2023-07-27 NOTE — Progress Notes (Signed)
 ANNUAL EXAM Patient name: Amy Rollins MRN [id number]  Date of birth: 1960-06-04 Chief Complaint:   AEX  History of Present Illness:   Amy Rollins is a 63 y.o. G2P2 Caucasian female being seen today for a routine annual exam.   Doing well.   Denies vaginal bleeding.  Does have some issues with hemorrhoids at times.   She is having some hair thinning.  She did have normal thyroid testing in October.     Patient's last menstrual period was 11/14/2011 (approximate).  The pregnancy intention screening data noted above was reviewed. Potential methods of contraception were discussed. The patient elected to proceed with No data recorded.   Last pap 07/12/2022. Results were: NILM w/ HRHPV negative. H/O abnormal pap: no Last mammogram: 03/07/2022. Results were: normal. Family h/o breast cancer: maternal aunt Last colonoscopy: 02/05/2015. Results were: normal. Family h/o colorectal cancer: no DEXA:  T score -2.4.  03/07/2022.     07/27/2023    8:50 AM 07/12/2022    8:46 AM 04/23/2021    9:00 AM  Depression screen PHQ 2/9  Decreased Interest 0 0 0  Down, Depressed, Hopeless 0 0 0  PHQ - 2 Score 0 0 0    Review of Systems:   Pertinent items are noted in HPI  Denies vaginal bleeding, vaginal discharge, pelvic pain.  Pertinent History Reviewed:  Reviewed past medical,surgical, social and family history.   Reviewed problem list, medications and allergies. Physical Assessment:   Vitals:   07/27/23 0840  BP: 110/72  Pulse: 73  Weight: 203 lb 3.2 oz (92.2 kg)  Height: 5\' 3"  (1.6 m)  Body mass index is 36 kg/m.        Physical Examination:   General appearance - well appearing, and in no distress  Mental status - alert, oriented to person, place, and time  Psych:  She has a normal mood and affect  Skin - warm and dry, normal color, no suspicious lesions noted  Chest - effort normal, all lung fields clear to auscultation bilaterally  Heart - normal rate  and regular rhythm  Neck:  midline trachea, no thyromegaly or nodules  Breasts - breasts appear normal, no suspicious masses, no skin or nipple changes or  axillary nodes  Abdomen - soft, nontender, nondistended, no masses or organomegaly  Pelvic - VULVA: normal appearing vulva with no masses, tenderness or lesions   VAGINA: normal appearing vagina with normal color and discharge, no lesions   CERVIX: normal appearing cervix without discharge or lesions, no CMT  Thin prep pap not done this year.  UTERUS: uterus is felt to be normal size, shape, consistency and nontender   ADNEXA: No adnexal masses or tenderness noted.  Rectal - normal rectal, good sphincter tone, hemorrhoids noted  Extremities:  No swelling or varicosities noted  Chaperone present for exam, Ina Homes, CMA.  Assessment & Plan:  1. Well woman exam with routine gynecological exam (Primary) - Pap smear 2024.  Not indicated. - Mammogram 02/2022. - Colonoscopy 2016.  Follow up 10 years. - Bone mineral density will be ordered at next visit. - lab work done with PCP - vaccines reviewed/updated  2. Vitamin D deficiency - VITAMIN D 25 Hydroxy (Vit-D Deficiency, Fractures) - Vitamin D, Ergocalciferol, (DRISDOL) 1.25 MG (50000 UNIT) CAPS capsule; Take 1 capsule (50,000 Units total) by mouth every 7 (seven) days.  Dispense: 12 capsule; Refill: 4  3. Osteopenia of lumbar spine - on prescription Vit D  4. Hair thinning - Ferritin - she will follow up with dermatologist as well   Meds:  Meds ordered this encounter  Medications   Vitamin D, Ergocalciferol, (DRISDOL) 1.25 MG (50000 UNIT) CAPS capsule    Sig: Take 1 capsule (50,000 Units total) by mouth every 7 (seven) days.    Dispense:  12 capsule    Refill:  4    Follow-up: Return in about 1 year (around 07/26/2024).  Jerene Bears, MD 07/27/2023 9:26 AM

## 2023-07-28 LAB — FERRITIN: Ferritin: 91 ng/mL (ref 15–150)

## 2023-07-28 LAB — VITAMIN D 25 HYDROXY (VIT D DEFICIENCY, FRACTURES): Vit D, 25-Hydroxy: 54.7 ng/mL (ref 30.0–100.0)

## 2023-08-07 ENCOUNTER — Encounter (HOSPITAL_BASED_OUTPATIENT_CLINIC_OR_DEPARTMENT_OTHER): Payer: Self-pay | Admitting: Obstetrics & Gynecology

## 2023-10-24 ENCOUNTER — Other Ambulatory Visit: Payer: Self-pay | Admitting: Obstetrics & Gynecology

## 2023-10-24 DIAGNOSIS — Z1231 Encounter for screening mammogram for malignant neoplasm of breast: Secondary | ICD-10-CM

## 2023-11-01 ENCOUNTER — Other Ambulatory Visit (HOSPITAL_BASED_OUTPATIENT_CLINIC_OR_DEPARTMENT_OTHER): Payer: Self-pay | Admitting: Obstetrics & Gynecology

## 2023-11-01 DIAGNOSIS — K219 Gastro-esophageal reflux disease without esophagitis: Secondary | ICD-10-CM

## 2023-11-03 ENCOUNTER — Ambulatory Visit
Admission: RE | Admit: 2023-11-03 | Discharge: 2023-11-03 | Disposition: A | Discharge status: Home / Self Care | Source: Ambulatory Visit | Attending: Obstetrics & Gynecology | Admitting: Obstetrics & Gynecology

## 2023-11-03 DIAGNOSIS — Z1231 Encounter for screening mammogram for malignant neoplasm of breast: Secondary | ICD-10-CM

## 2023-11-07 ENCOUNTER — Other Ambulatory Visit: Payer: Self-pay | Admitting: Family Medicine

## 2023-11-07 DIAGNOSIS — R928 Other abnormal and inconclusive findings on diagnostic imaging of breast: Secondary | ICD-10-CM

## 2023-11-13 ENCOUNTER — Other Ambulatory Visit: Payer: Self-pay | Admitting: Obstetrics & Gynecology

## 2023-11-13 DIAGNOSIS — R928 Other abnormal and inconclusive findings on diagnostic imaging of breast: Secondary | ICD-10-CM

## 2023-11-14 ENCOUNTER — Ambulatory Visit
Admission: RE | Admit: 2023-11-14 | Discharge: 2023-11-14 | Disposition: A | Discharge status: Home / Self Care | Source: Ambulatory Visit | Attending: Family Medicine | Admitting: Family Medicine

## 2023-11-14 DIAGNOSIS — R928 Other abnormal and inconclusive findings on diagnostic imaging of breast: Secondary | ICD-10-CM

## 2023-11-16 ENCOUNTER — Encounter

## 2023-11-16 ENCOUNTER — Other Ambulatory Visit

## 2023-11-16 ENCOUNTER — Other Ambulatory Visit: Payer: Self-pay | Admitting: Obstetrics & Gynecology

## 2023-11-16 DIAGNOSIS — N6320 Unspecified lump in the left breast, unspecified quadrant: Secondary | ICD-10-CM

## 2023-12-07 ENCOUNTER — Ambulatory Visit (HOSPITAL_BASED_OUTPATIENT_CLINIC_OR_DEPARTMENT_OTHER): Payer: Self-pay | Admitting: Obstetrics & Gynecology

## 2024-05-20 ENCOUNTER — Other Ambulatory Visit

## 2024-05-20 ENCOUNTER — Other Ambulatory Visit: Payer: Self-pay | Admitting: Obstetrics & Gynecology

## 2024-05-20 DIAGNOSIS — N6320 Unspecified lump in the left breast, unspecified quadrant: Secondary | ICD-10-CM

## 2024-05-20 DIAGNOSIS — R928 Other abnormal and inconclusive findings on diagnostic imaging of breast: Secondary | ICD-10-CM

## 2024-05-21 ENCOUNTER — Other Ambulatory Visit

## 2024-07-29 ENCOUNTER — Ambulatory Visit (HOSPITAL_BASED_OUTPATIENT_CLINIC_OR_DEPARTMENT_OTHER): Admitting: Obstetrics & Gynecology

## 2024-11-19 ENCOUNTER — Encounter

## 2024-11-19 ENCOUNTER — Other Ambulatory Visit
